# Patient Record
Sex: Female | Born: 1970
Health system: Southern US, Community
[De-identification: ages and names within clinical notes are randomized; demographics above are authoritative.]

## PROBLEM LIST (undated history)

## (undated) DIAGNOSIS — D649 Anemia, unspecified: Secondary | ICD-10-CM

## (undated) DIAGNOSIS — T8172XA Complication of vein following a procedure, not elsewhere classified, initial encounter: Secondary | ICD-10-CM

## (undated) DIAGNOSIS — D259 Leiomyoma of uterus, unspecified: Secondary | ICD-10-CM

## (undated) DIAGNOSIS — I2699 Other pulmonary embolism without acute cor pulmonale: Secondary | ICD-10-CM

## (undated) HISTORY — PX: MOUTH SURGERY: SHX715

## (undated) HISTORY — DX: Complication of vein following a procedure, not elsewhere classified, initial encounter: T81.72XA

## (undated) HISTORY — PX: TUBAL LIGATION: SHX77

## (undated) HISTORY — PX: ABDOMINAL HYSTERECTOMY: SHX81

## (undated) HISTORY — DX: Other pulmonary embolism without acute cor pulmonale: I26.99

## (undated) HISTORY — DX: Anemia, unspecified: D64.9

## (undated) HISTORY — PX: NOVASURE ABLATION: SHX5394

## (undated) HISTORY — PX: LASIK: SHX215

## (undated) HISTORY — DX: Leiomyoma of uterus, unspecified: D25.9

---

## 1998-09-09 HISTORY — PX: MYOMECTOMY: SHX85

## 2001-11-11 ENCOUNTER — Other Ambulatory Visit: Admission: RE | Admit: 2001-11-11 | Discharge: 2001-11-11 | Payer: Self-pay | Admitting: Obstetrics and Gynecology

## 2002-12-31 ENCOUNTER — Other Ambulatory Visit: Admission: RE | Admit: 2002-12-31 | Discharge: 2002-12-31 | Payer: Self-pay | Admitting: Obstetrics and Gynecology

## 2003-07-05 ENCOUNTER — Other Ambulatory Visit: Admission: RE | Admit: 2003-07-05 | Discharge: 2003-07-05 | Payer: Self-pay | Admitting: Obstetrics and Gynecology

## 2003-07-06 ENCOUNTER — Other Ambulatory Visit: Admission: RE | Admit: 2003-07-06 | Discharge: 2003-07-06 | Payer: Self-pay | Admitting: Obstetrics and Gynecology

## 2003-09-10 HISTORY — PX: TUBAL LIGATION: SHX77

## 2003-12-07 ENCOUNTER — Inpatient Hospital Stay (HOSPITAL_COMMUNITY): Admission: AD | Admit: 2003-12-07 | Discharge: 2003-12-07 | Payer: Self-pay | Admitting: Obstetrics and Gynecology

## 2003-12-13 ENCOUNTER — Inpatient Hospital Stay (HOSPITAL_COMMUNITY): Admission: AD | Admit: 2003-12-13 | Discharge: 2003-12-13 | Payer: Self-pay | Admitting: Obstetrics and Gynecology

## 2003-12-22 ENCOUNTER — Inpatient Hospital Stay (HOSPITAL_COMMUNITY): Admission: AD | Admit: 2003-12-22 | Discharge: 2003-12-22 | Payer: Self-pay | Admitting: *Deleted

## 2003-12-26 ENCOUNTER — Encounter: Admission: RE | Admit: 2003-12-26 | Discharge: 2003-12-26 | Payer: Self-pay | Admitting: Obstetrics and Gynecology

## 2003-12-29 ENCOUNTER — Inpatient Hospital Stay (HOSPITAL_COMMUNITY): Admission: AD | Admit: 2003-12-29 | Discharge: 2004-01-02 | Payer: Self-pay | Admitting: Obstetrics and Gynecology

## 2003-12-29 ENCOUNTER — Encounter (INDEPENDENT_AMBULATORY_CARE_PROVIDER_SITE_OTHER): Payer: Self-pay | Admitting: Specialist

## 2007-08-26 ENCOUNTER — Ambulatory Visit (HOSPITAL_COMMUNITY): Admission: RE | Admit: 2007-08-26 | Discharge: 2007-08-26 | Payer: Self-pay | Admitting: Obstetrics and Gynecology

## 2011-01-22 NOTE — Op Note (Signed)
Julie Johns, Julie Johns            ACCOUNT NO.:  [id number]   MEDICAL RECORD NO.:  [id number]          PATIENT TYPE:  AMB   LOCATION:  SDC                           FACILITY:  [facility]   PHYSICIAN:  Maxie Better, M.D.DATE OF BIRTH:  Jun 09, 1971   DATE OF PROCEDURE:  08/26/2007  DATE OF DISCHARGE:                               OPERATIVE REPORT   PREOPERATIVE DIAGNOSIS:  Menorrhagia.   PROCEDURE:  Diagnostic hysteroscopy, Novasure endometrial ablation.   POSTOPERATIVE DIAGNOSIS:  Menorrhagia, endometrial polyp.   ANESTHESIA:  General, paracervical block.   SURGEON:  Maxie Better, M.D.   PROCEDURE:  Under adequate general anesthesia the patient is placed in  the dorsal lithotomy position.  She was sterilely prepped and draped in  usual fashion.  The bladder was catheterized for small amount of urine.  Examination under anesthesia revealed an anteverted uterus about 7-week  size.  No adnexal masses could be appreciated.  A bivalve speculum  placed in vagina.  0.5% Marcaine was injected paracervical at 3 and 9  o'clock.  Prior to transfer to the operating room the patient had  received IV Toradol.  The cervix was grasped with a single-tooth  tenaculum.  The uterus sounded to 8 cm.  The endocervical canal sounded  to 4 cm. Difference in cavity is therefore 4 cm.  The cervix was then  dilated gently, diagnostic hysteroscope was introduced in the cavity and  endometrium was consistent with prior use of birth control pills.  There  was a small polypoid lesion noted on the right as well as a small raised  lesion which was either small fibroid or polyp.  The hysteroscope was  removed, cervix was dilated #8.  The Novasure apparatus was introduced  after setting of four had been placed with a cavity depth and the  instrument was used to explore the cavity in all quadrants.  It was then  ultimately tested and finally deployed and with the lasting for 75  seconds.  The Novasure apparatus  was then removed.  The hysteroscope was  then re-inserted and evidence of complete ablation of the cavity was  accomplished.  Procedure was then terminated by removing all  instruments.  Specimen was none.  Complication was none.  Fluid deficit  was 100 mL.  The patient tolerated the procedure well was transferred to  recovery room in stable condition.      Maxie Better, M.D.  Electronically Signed     [place]/MEDQ  D:  08/26/2007  T:  08/27/2007  Job:  [id number]

## 2011-01-25 NOTE — Op Note (Signed)
NAMETARON, CONREY                        ACCOUNT NO.:  [id number]   MEDICAL RECORD NO.:  [id number]                   PATIENT TYPE:  INP   LOCATION:  9112                                 FACILITY:  [facility]   PHYSICIAN:  Maxie Better, M.D.            DATE OF BIRTH:  November 19, 1970   DATE OF PROCEDURE:  12/29/2003  DATE OF DISCHARGE:                                 OPERATIVE REPORT   PREOPERATIVE DIAGNOSES:  1. Preterm labor.  2. Previous cesarean section.  3. Desires sterilization.  4. Keloid scar.  5. Intrauterine gestation at 35 weeks.   PROCEDURES:  1. Repeat cesarean section.  2. Modified Pomeroy tubal ligation.  3. Revision of keloid scar.  4. Submucosal myomectomy.   POSTOPERATIVE DIAGNOSES:  1. Preterm labor.  2. Intrauterine gestation at 35 weeks.  3. Previous cesarean section.  4. Desires sterilization.  5. Keloid scar.  6. Submucosal fibroid.   ANESTHESIA:  Spinal.   SURGEON:  Maxie Better, M.D.   ASSISTANT:  Charles A. Clearance Coots, M.D.   INDICATIONS:  This is a 40 year old gravida 3, para 0-1-1-1, married black  female at 26 weeks' gestation with a previous cesarean section, who  presented in labor and who desires a repeat cesarean section.  The patient  also wants a permanent sterilization.  She was scheduled for a repeat  cesarean section and tubal ligation on Jan 27, 2004.  Her prenatal care has  been complicated by preterm labor, for which the patient has been on  tocolysis until today.  The patient has also received Delalutin to prevent  preterm labor/preterm delivery.  She received betamethasone at 28 weeks, as  she has been followed with fetal fibronectin every two weeks as well as  cervical length by ultrasound.  Her history is notable for a previous  myomectomy and a previous preterm delivery at 32 weeks.  The patient has  intact membranes.  Her group B strep culture at 28 weeks was negative.  The  patient presented with complaints of very  painful contractions occurring  about every two minutes.  She had good fetal movement.  Her exam, which had  been consistently long and closed in the office, was 1 cm dilated, vertex at  a -2 station.  The baby had been evaluated for a fetal arrhythmia with a  normal echocardiogram and a finding of premature atrial contractions, which  appears to have resolved.  Due to the gestational age and the change in  cervical dilation, the decision was made to proceed with a repeat cesarean  section at this time.  The patient has had a keloid scar noted and removal  was discussed and agreed upon.  Risks and benefits of the planned procedure  were explained to the patient and her husband, consent was signed, and the  patient was transferred to the operating room.   PROCEDURE:  Under adequate spinal anesthesia, the patient was placed in the  supine  position with a left lateral tilt.  She was sterilely prepped and  draped in the usual fashion.  An indwelling Foley catheter was sterilely  placed.  The patient has a known LATEX allergy, and therefore this was  avoided.  A large vertical keloid scar was noted.  Marcaine 0.25% was  injected along the previous scar and to the right of the edge of the keloid.  A vertical incision is then made, carried down to the rectus fascia using  Bovie cautery.  The rectus fascia was opened superiorly and inferiorly.  The  parietal peritoneum was then opened under direct visualization superiorly  and inferiorly.  There was a small amount of adhesion on the right lower  aspect of the uterus.  The lower uterine segment was well-developed.  The  vesicouterine peritoneum was then opened, the bladder bluntly dissected off  the lower uterine segment and displaced inferiorly with a bladder retractor.  A curvilinear low transverse uterine incision was then made and extended  bilaterally with bandage scissors.  Artificial rupture of membranes  occurred.  Clear fluid was noted.   Subsequent delivery of a live female from  the vertex position was accomplished with a cord around the neck easily  reduced.  The cord was clamped, cut.  The baby was bulb-suctioned on the  abdomen, and the baby was transferred to the awaiting pediatricians, who  subsequently assigned Apgars of 8 and 9 at one and five minutes.  Subsequent  weight of the baby was 5 pounds 4 ounces.  The placenta, which was  posterior, was spontaneous in its removal and was intact.  It was sent to  pathology.  The uterus was not exteriorized.  There was a palpable small  subserosal fibroid on the anterior aspect of the uterus.  On its palpation  and cleaning of the intrauterine cavity, it was noted that there was a 3.5  cm submucosal fibroid projecting into the cavity.  Due to its location and  ability for removal, a decision was then made to remove the fibroid.  The  fibroid was grasped with a towel clip and cautery was then used to open the  overlying endometrium.  The fibroid was then enucleated from its base.  A  smaller fibroid was then found, and it was also removed at that time.  The  defect in the uterus was then closed with a 0 Monocryl figure-of-eight  suture and the specimen sent off.  The uterine cavity was then cleaned and  with additional palpation, no further fibroids were noted.  The uterine  incision had no extension.  The uterine incision was closed with 0 Monocryl  running locked stitch in one layer, good hemostasis then noted.  The  vesicouterine peritoneum was not bleeding.  Attention was then turned to the  tubes and ovaries.  Both ovaries were normal.  There was an area adherent to  a portion of the tube in its midsegment bilaterally, and this appeared to be  congenital rather than secondary to adhesion.  The round ligament was  identified bilaterally and the fallopian tube was identified down to its  fimbriated end.  The midportion of the tube away from this partially adherent site was  tented with a Babcock.  The underlying mesosalpinx was  then opened.  The proximal and distal portion of the tube was tied with 0  chromic x2 proximally and distally and the intervening segment of tube was  then removed.  This was performed on the contralateral  tube as well.  The  abdomen was then irrigated and suctioned of debris.  Attention was then  turned back to the uterine incision, which good hemostasis was noted, and a  second layer closure was not placed.  The paracolic gutters were cleaned of  debris.  The rectus fascia was then closed with 0 Vicryl x2.  At that point  the remaining portion of the keloid scar was then sharply dissected and the  underlying scar tissue was removed.  The subcutaneous area was then closed  with interrupted 3-0 plain suture as well as a running stitch of 2-0 plain  suture noted to approximate the skin incision.  The skin was then  approximated with Ethicon staples without tension on the incision site.   SPECIMENS:  Placenta sent to pathology, portion of right and left fallopian  tubes, and keloid scar.   ESTIMATED BLOOD LOSS:  600 mL.   FLUIDS REPLACED:  1700 mL crystalloid.   URINE OUTPUT:  300 mL clear yellow urine.   Sponge and instrument count x2 was correct.   COMPLICATIONS:  None.   The patient was transferred to the recovery room in stable condition.                                               Maxie Better, M.D.    [place]/MEDQ  D:  12/29/2003  T:  12/30/2003  Job:  [id number]

## 2011-01-25 NOTE — Discharge Summary (Signed)
NAMEFARHA, DANO                        ACCOUNT NO.:  [id number]   MEDICAL RECORD NO.:  [id number]                   PATIENT TYPE:  INP   LOCATION:  9112                                 [facility]ACILITY:  [facility]   PHYSICIAN:  Maxie Better, M.D.            DATE OF BIRTH:  Jan 13, 1971   DATE OF ADMISSION:  12/29/2003  DATE OF DISCHARGE:  01/02/2004                                 DISCHARGE SUMMARY   ADMISSION DIAGNOSES:  1. Preterm labor.  2. Intrauterine gestation at 35 weeks.  3. Previous cesarean section.  4. Desires sterilization.   DISCHARGE DIAGNOSES:  1. Intrauterine gestation at 35 weeks, delivered.  2. Desire sterilization.  3. Submucosal fibroid.  4. Maternal bradycardia.  5. Iron deficiency anemia.   PROCEDURE:  Repeat cesarean section, modified Pomeroy tubal ligation,  submucosal myomectomy.   HISTORY OF PRESENT ILLNESS:  This is a 40 year old G3 P0-1-1-1 married black  female at 68 weeks with a previous cesarean section admitted in labor on  December 29, 2003.  Her prenatal course had been notable for preterm labor  treated with tocolytics until 35 weeks.  She had been followed with fetal  fibronectin every 2 weeks and was given alpha-hydroxyprogesterone weekly  until 34 weeks.  Her previous delivery had been at 32 weeks.  The patient  also desires sterilization.   HOSPITAL COURSE:  The patient was admitted to [facility].  She was  confirmed to be contracting every 2 minutes, uncomfortable.  Her exam was 1  cm dilated.  A couple days prior to her admission she had been closed and  long in the office.  With the diagnosis of preterm labor the patient was  taken to the operating room for a repeat cesarean section and permanent  sterilization.  The patient underwent a repeat cesarean section with the  resultant delivery of a live female weighing 5 pounds 4 ounces with Apgars of  8 and 9.  The patient also underwent bilateral tubal ligation.  A submucosal  fibroid  was noted and was removed at the time of her surgery.  The patient's  postoperative course was complicated by postoperative day #4 the patient  complained of difficulty lying flat, feels like she is unable to get any  air.  She had no chest pain.  The patient had felt well until this slight  episode the night previously.  However, her pulse had not been taken at the  time.  Her evaluation by her R.N. revealed her blood pressure was elevated,  she had a pulse of 50.  Her blood pressures ranged 108 to 120 over 59 to 73  during her hospitalization.  During the episode her blood pressure was  136/81, 146/85, 151/85, pulse of 50.  She had on clinical exam lungs that  were clear to auscultation, bradycardia with a grade 1/6 systolic ejection  murmur.  Her extremities had no edema.  She had no calf tenderness.  Chest x-  ray and EKG was ordered.  Sinus bradycardia was noted on EKG with a question  of a lateral infarct.  This EKG was reviewed by our anesthesiologist who did  not feel that there was any infarct noted.  The patient's oxygen saturation  was 98%.  Her chest x-ray showed mild cardiac enlargement, no pulmonary  edema.  Her clinical picture was not consistent with pulmonary embolism or  peripartum cardiomyopathy.  The patient later on that morning was seen.  Her  symptoms were unchanged, her clinical exam was unchanged, and with respect  to her surgery, was doing well.  The cardiac issue was at the forefront.  Therefore, a cardiology consultation was obtained in [facility] which is  where the patient lives and practices and is familiar with that cardiology  practice.  She was scheduled for an appointment with the cardiologist at 2  p.m. that afternoon.  The patient otherwise was afebrile, had bowel  movement, was tolerating a regular diet.  Her incision showed no erythema,  induration, or exudate, was a vertical incision with staples.  PIH labs had  not been performed as the clinical  picture was not consistent with that.  CBC on postoperative day #1 showed a hemoglobin of 9.5; her preoperative  hemoglobin was 10.7; platelet count was 257,000; and white count was 14.9.  The patient was deemed well to be otherwise discharged.  Her blood pressure  had been 151/87, her pulse was 50-60.   DISPOSITION:  Home with appointment at 2 p.m. with the cardiologist in [facility], Dr. Bary Castilla.   CONDITION:  Stable.   FOLLOW-UP APPOINTMENT:  1. Staple removal on Jan 09, 2004 at the office.  2. Dr. Bary Castilla at [facility] group at 2 p.m.  3. Postpartum OB care in 4-6 weeks.   DISCHARGE MEDICATIONS:  1. Iron 325 mg one p.o. b.i.d.  2. Prenatal vitamins one p.o. daily.  3. Darvocet-N 100 #20 one to two tablets q.3-4h. p.r.n. pain.  4. Motrin 600 mg #90 one p.o. q.6h. p.r.n. pain.   DISCHARGE INSTRUCTIONS:  Per postpartum booklet given to the patient.   FINAL PATHOLOGY:  Complete transection of the fallopian tubes.  Placenta  with a partial circummarginate membrane insertion.  Fragment of a  leiomyomata measuring 25 g and 5.2 cm.                                               Maxie Better, M.D.    [place]/MEDQ  D:  01/13/2004  T:  01/13/2004  Job:  [id number]

## 2011-06-14 LAB — CBC
HCT: 37.6
Hemoglobin: 12.9
MCHC: 34.3
MCV: 96.9
Platelets: 237
RBC: 3.88
RDW: 12
WBC: 8.7

## 2012-04-17 ENCOUNTER — Other Ambulatory Visit: Payer: Self-pay | Admitting: Obstetrics and Gynecology

## 2012-04-17 DIAGNOSIS — D219 Benign neoplasm of connective and other soft tissue, unspecified: Secondary | ICD-10-CM

## 2012-04-21 ENCOUNTER — Ambulatory Visit
Admission: RE | Admit: 2012-04-21 | Discharge: 2012-04-21 | Disposition: A | Discharge status: Home / Self Care | Payer: Commercial Managed Care - PPO | Source: Ambulatory Visit | Attending: Obstetrics and Gynecology | Admitting: Obstetrics and Gynecology

## 2012-04-21 DIAGNOSIS — D219 Benign neoplasm of connective and other soft tissue, unspecified: Secondary | ICD-10-CM

## 2012-04-21 MED ORDER — GADOBENATE DIMEGLUMINE 529 MG/ML IV SOLN: 13.0000 mL | Freq: Once | INTRAVENOUS | Status: AC | PRN

## 2012-05-04 ENCOUNTER — Encounter (HOSPITAL_COMMUNITY): Payer: Self-pay | Admitting: *Deleted

## 2012-05-09 ENCOUNTER — Encounter (HOSPITAL_COMMUNITY): Payer: Self-pay | Admitting: Pharmacist

## 2012-05-13 ENCOUNTER — Other Ambulatory Visit: Payer: Self-pay | Admitting: Obstetrics and Gynecology

## 2012-05-20 MED ORDER — MUPIROCIN 2 % EX OINT: TOPICAL_OINTMENT | Freq: Two times a day (BID) | CUTANEOUS | Status: DC

## 2012-05-21 ENCOUNTER — Encounter (HOSPITAL_COMMUNITY): Payer: Self-pay

## 2012-05-21 ENCOUNTER — Ambulatory Visit (HOSPITAL_COMMUNITY): Payer: Commercial Managed Care - PPO | Admitting: Anesthesiology

## 2012-05-21 ENCOUNTER — Encounter (HOSPITAL_COMMUNITY): Payer: Self-pay | Admitting: Anesthesiology

## 2012-05-21 ENCOUNTER — Encounter (HOSPITAL_COMMUNITY)
Admission: RE | Disposition: A | Discharge status: Home / Self Care | Payer: Self-pay | Source: Ambulatory Visit | Attending: Obstetrics and Gynecology

## 2012-05-21 ENCOUNTER — Observation Stay (HOSPITAL_COMMUNITY)
Admission: RE | Admit: 2012-05-21 | Discharge: 2012-05-21 | Disposition: A | Discharge status: Home / Self Care | Payer: Commercial Managed Care - PPO | Source: Ambulatory Visit | Attending: Obstetrics and Gynecology | Admitting: Obstetrics and Gynecology

## 2012-05-21 DIAGNOSIS — D252 Subserosal leiomyoma of uterus: Secondary | ICD-10-CM | POA: Insufficient documentation

## 2012-05-21 DIAGNOSIS — D219 Benign neoplasm of connective and other soft tissue, unspecified: Secondary | ICD-10-CM

## 2012-05-21 DIAGNOSIS — Z90710 Acquired absence of both cervix and uterus: Secondary | ICD-10-CM

## 2012-05-21 DIAGNOSIS — N920 Excessive and frequent menstruation with regular cycle: Secondary | ICD-10-CM | POA: Insufficient documentation

## 2012-05-21 DIAGNOSIS — D251 Intramural leiomyoma of uterus: Principal | ICD-10-CM | POA: Insufficient documentation

## 2012-05-21 LAB — BASIC METABOLIC PANEL WITH GFR
BUN: 10 mg/dL (ref 6–23)
CO2: 26 meq/L (ref 19–32)
Calcium: 9.3 mg/dL (ref 8.4–10.5)
Chloride: 102 meq/L (ref 96–112)
Creatinine, Ser: 0.69 mg/dL (ref 0.50–1.10)
GFR calc Af Amer: >90 mL/min (ref >90)
GFR calc non Af Amer: >90 mL/min (ref >90)
Glucose, Bld: 87 mg/dL (ref 70–99)
Potassium: 3.9 meq/L (ref 3.5–5.1)
Sodium: 137 meq/L (ref 135–145)

## 2012-05-21 LAB — BASIC METABOLIC PANEL
BUN: 11 mg/dL (ref 6–23)
CO2: 24 mEq/L (ref 19–32)
Calcium: 8.9 mg/dL (ref 8.4–10.5)
Chloride: 99 mEq/L (ref 96–112)
Creatinine, Ser: 0.64 mg/dL (ref 0.50–1.10)
GFR calc Af Amer: >90 mL/min (ref >90)
GFR calc non Af Amer: >90 mL/min (ref >90)
Glucose, Bld: 118 mg/dL — ABNORMAL HIGH (ref 70–99)
Potassium: 4.2 mEq/L (ref 3.5–5.1)
Sodium: 134 mEq/L — ABNORMAL LOW (ref 135–145)

## 2012-05-21 LAB — CBC
HCT: 37.7 % (ref 36.0–46.0)
HCT: 39.7 % (ref 36.0–46.0)
Hemoglobin: 12.6 g/dL (ref 12.0–15.0)
Hemoglobin: 13.3 g/dL (ref 12.0–15.0)
MCH: 31.7 pg (ref 26.0–34.0)
MCH: 31.8 pg (ref 26.0–34.0)
MCHC: 33.4 g/dL (ref 30.0–36.0)
MCHC: 33.5 g/dL (ref 30.0–36.0)
MCV: 95 fL (ref 78.0–100.0)
MCV: 95 fL (ref 78.0–100.0)
Platelets: 219 K/uL (ref 150–400)
Platelets: 229 10*3/uL (ref 150–400)
RBC: 3.97 MIL/uL (ref 3.87–5.11)
RBC: 4.18 MIL/uL (ref 3.87–5.11)
RDW: 12 % (ref 11.5–15.5)
RDW: 12 % (ref 11.5–15.5)
WBC: 7 K/uL (ref 4.0–10.5)
WBC: 17 10*3/uL — ABNORMAL HIGH (ref 4.0–10.5)

## 2012-05-21 LAB — SURGICAL PCR SCREEN
MRSA, PCR: NEGATIVE
Staphylococcus aureus: NEGATIVE

## 2012-05-21 SURGERY — ROBOTIC ASSISTED TOTAL HYSTERECTOMY
Anesthesia: General | Site: Abdomen | Wound class: Clean Contaminated

## 2012-05-21 MED ORDER — ONDANSETRON HCL 4 MG/2ML IJ SOLN
INTRAMUSCULAR | Status: AC
  Filled 2012-05-21: qty 2

## 2012-05-21 MED ORDER — MIDAZOLAM HCL 2 MG/2ML IJ SOLN
INTRAMUSCULAR | Status: AC
  Filled 2012-05-21: qty 2

## 2012-05-21 MED ORDER — TRAMADOL HCL 50 MG PO TABS: 50.0000 mg | ORAL_TABLET | Freq: Four times a day (QID) | ORAL | Status: DC | PRN

## 2012-05-21 MED ORDER — ONDANSETRON HCL 4 MG PO TABS: 4.0000 mg | ORAL_TABLET | Freq: Four times a day (QID) | ORAL | Status: DC | PRN

## 2012-05-21 MED ORDER — SCOPOLAMINE 1 MG/3DAYS TD PT72
MEDICATED_PATCH | TRANSDERMAL | Status: AC
  Administered 2012-05-21: 1.5 mg via TRANSDERMAL
  Filled 2012-05-21: qty 1

## 2012-05-21 MED ORDER — FENTANYL CITRATE 0.05 MG/ML IJ SOLN
INTRAMUSCULAR | Status: DC | PRN
  Administered 2012-05-21: 100 ug via INTRAVENOUS
  Administered 2012-05-21: 50 ug via INTRAVENOUS
  Administered 2012-05-21: 150 ug via INTRAVENOUS
  Administered 2012-05-21: 50 ug via INTRAVENOUS

## 2012-05-21 MED ORDER — TRAMADOL HCL 50 MG PO TABS: 50.0000 mg | ORAL_TABLET | Freq: Four times a day (QID) | ORAL | Status: AC | PRN

## 2012-05-21 MED ORDER — IBUPROFEN 800 MG PO TABS
800.0000 mg | ORAL_TABLET | Freq: Three times a day (TID) | ORAL | Status: DC | PRN
  Filled 2012-05-21: qty 1

## 2012-05-21 MED ORDER — GLYCOPYRROLATE 0.2 MG/ML IJ SOLN
INTRAMUSCULAR | Status: AC
  Filled 2012-05-21: qty 1

## 2012-05-21 MED ORDER — ACETAMINOPHEN 10 MG/ML IV SOLN: 1000.0000 mg | Freq: Once | INTRAVENOUS | Status: DC

## 2012-05-21 MED ORDER — ARTIFICIAL TEARS OP OINT
TOPICAL_OINTMENT | OPHTHALMIC | Status: AC
  Filled 2012-05-21: qty 3.5

## 2012-05-21 MED ORDER — SCOPOLAMINE 1 MG/3DAYS TD PT72
1.0000 | MEDICATED_PATCH | TRANSDERMAL | Status: DC
  Administered 2012-05-21: 1.5 mg via TRANSDERMAL

## 2012-05-21 MED ORDER — OXYCODONE-ACETAMINOPHEN 5-325 MG PO TABS
1.0000 | ORAL_TABLET | ORAL | Status: DC | PRN
Start: 2012-05-21 — End: 2012-05-21

## 2012-05-21 MED ORDER — TRIAMCINOLONE ACETONIDE 40 MG/ML IJ SUSP
Freq: Once | INTRAMUSCULAR | Status: AC
  Administered 2012-05-21: 12:00:00 via INTRAMUSCULAR
  Filled 2012-05-21 (×2): qty 1

## 2012-05-21 MED ORDER — KETOROLAC TROMETHAMINE 30 MG/ML IJ SOLN
15.0000 mg | Freq: Once | INTRAMUSCULAR | Status: AC | PRN
  Administered 2012-05-21: 30 mg via INTRAVENOUS

## 2012-05-21 MED ORDER — MUPIROCIN 2 % EX OINT
TOPICAL_OINTMENT | CUTANEOUS | Status: AC
  Administered 2012-05-21: 1 via NASAL
  Filled 2012-05-21: qty 22

## 2012-05-21 MED ORDER — LACTATED RINGERS IV BOLUS (SEPSIS): 1000.0000 mL | Freq: Once | INTRAVENOUS | Status: DC

## 2012-05-21 MED ORDER — ROCURONIUM BROMIDE 100 MG/10ML IV SOLN
INTRAVENOUS | Status: DC | PRN
  Administered 2012-05-21: 10 mg via INTRAVENOUS
  Administered 2012-05-21: 5 mg via INTRAVENOUS
  Administered 2012-05-21 (×3): 10 mg via INTRAVENOUS
  Administered 2012-05-21: 45 mg via INTRAVENOUS
  Administered 2012-05-21: 5 mg via INTRAVENOUS

## 2012-05-21 MED ORDER — KETOROLAC TROMETHAMINE 30 MG/ML IJ SOLN
INTRAMUSCULAR | Status: DC | PRN
  Administered 2012-05-21: 30 mg via INTRAVENOUS

## 2012-05-21 MED ORDER — BUPIVACAINE HCL (PF) 0.25 % IJ SOLN
INTRAMUSCULAR | Status: AC
  Filled 2012-05-21: qty 30

## 2012-05-21 MED ORDER — ONDANSETRON HCL 4 MG/2ML IJ SOLN
INTRAMUSCULAR | Status: DC | PRN
  Administered 2012-05-21: 4 mg via INTRAVENOUS

## 2012-05-21 MED ORDER — NEOSTIGMINE METHYLSULFATE 1 MG/ML IJ SOLN
INTRAMUSCULAR | Status: AC
  Filled 2012-05-21: qty 10

## 2012-05-21 MED ORDER — INFLUENZA VIRUS VACC SPLIT PF IM SUSP
0.5000 mL | INTRAMUSCULAR | Status: AC
  Administered 2012-05-21: 0.5 mL via INTRAMUSCULAR
  Filled 2012-05-21: qty 0.5

## 2012-05-21 MED ORDER — DEXTROSE IN LACTATED RINGERS 5 % IV SOLN: INTRAVENOUS | Status: DC

## 2012-05-21 MED ORDER — MIDAZOLAM HCL 5 MG/5ML IJ SOLN
INTRAMUSCULAR | Status: DC | PRN
  Administered 2012-05-21: 2 mg via INTRAVENOUS

## 2012-05-21 MED ORDER — IBUPROFEN 800 MG PO TABS: 800.0000 mg | ORAL_TABLET | Freq: Four times a day (QID) | ORAL | Status: DC | PRN

## 2012-05-21 MED ORDER — CEFAZOLIN SODIUM-DEXTROSE 2-3 GM-% IV SOLR
2.0000 g | INTRAVENOUS | Status: AC
  Administered 2012-05-21: 2 g via INTRAVENOUS

## 2012-05-21 MED ORDER — MENTHOL 3 MG MT LOZG: 1.0000 | LOZENGE | OROMUCOSAL | Status: DC | PRN

## 2012-05-21 MED ORDER — HYDROMORPHONE HCL 2 MG PO TABS
4.0000 mg | ORAL_TABLET | ORAL | Status: DC | PRN
  Administered 2012-05-21: 2 mg via ORAL
  Filled 2012-05-21: qty 2

## 2012-05-21 MED ORDER — FENTANYL CITRATE 0.05 MG/ML IJ SOLN
INTRAMUSCULAR | Status: AC
  Filled 2012-05-21: qty 2

## 2012-05-21 MED ORDER — PROPOFOL 10 MG/ML IV EMUL
INTRAVENOUS | Status: DC | PRN
  Administered 2012-05-21: 150 mg via INTRAVENOUS

## 2012-05-21 MED ORDER — KETOROLAC TROMETHAMINE 30 MG/ML IJ SOLN: 30.0000 mg | Freq: Four times a day (QID) | INTRAMUSCULAR | Status: DC

## 2012-05-21 MED ORDER — METOCLOPRAMIDE HCL 5 MG/ML IJ SOLN: 10.0000 mg | Freq: Once | INTRAMUSCULAR | Status: DC | PRN

## 2012-05-21 MED ORDER — GLYCOPYRROLATE 0.2 MG/ML IJ SOLN
INTRAMUSCULAR | Status: DC | PRN
  Administered 2012-05-21: .6 mg via INTRAVENOUS

## 2012-05-21 MED ORDER — MUPIROCIN 2 % EX OINT
TOPICAL_OINTMENT | Freq: Two times a day (BID) | CUTANEOUS | Status: DC
  Administered 2012-05-21: 1 via NASAL

## 2012-05-21 MED ORDER — KETOROLAC TROMETHAMINE 30 MG/ML IJ SOLN
INTRAMUSCULAR | Status: AC
  Filled 2012-05-21: qty 1

## 2012-05-21 MED ORDER — ACETAMINOPHEN 10 MG/ML IV SOLN
1000.0000 mg | Freq: Four times a day (QID) | INTRAVENOUS | Status: DC
  Administered 2012-05-21: 1000 mg via INTRAVENOUS

## 2012-05-21 MED ORDER — ONDANSETRON HCL 4 MG/2ML IJ SOLN: 4.0000 mg | Freq: Four times a day (QID) | INTRAMUSCULAR | Status: DC | PRN

## 2012-05-21 MED ORDER — HYDROMORPHONE HCL PF 1 MG/ML IJ SOLN
0.2000 mg | INTRAMUSCULAR | Status: DC | PRN
  Administered 2012-05-21: 0.5 mg via INTRAVENOUS
  Filled 2012-05-21: qty 1

## 2012-05-21 MED ORDER — DEXAMETHASONE SODIUM PHOSPHATE 10 MG/ML IJ SOLN
INTRAMUSCULAR | Status: AC
  Filled 2012-05-21: qty 1

## 2012-05-21 MED ORDER — DEXAMETHASONE SODIUM PHOSPHATE 4 MG/ML IJ SOLN
INTRAMUSCULAR | Status: DC | PRN
  Administered 2012-05-21: 10 mg via INTRAVENOUS

## 2012-05-21 MED ORDER — NEOSTIGMINE METHYLSULFATE 1 MG/ML IJ SOLN
INTRAMUSCULAR | Status: DC | PRN
  Administered 2012-05-21: 3 mg via INTRAVENOUS

## 2012-05-21 MED ORDER — HYDROMORPHONE HCL 2 MG PO TABS: ORAL_TABLET | ORAL | Status: DC

## 2012-05-21 MED ORDER — IBUPROFEN 800 MG PO TABS: 800.0000 mg | ORAL_TABLET | Freq: Three times a day (TID) | ORAL | Status: AC | PRN

## 2012-05-21 MED ORDER — HYDROMORPHONE HCL 2 MG PO TABS
2.0000 mg | ORAL_TABLET | ORAL | Status: DC | PRN
  Administered 2012-05-21: 4 mg via ORAL
  Filled 2012-05-21: qty 2

## 2012-05-21 MED ORDER — ZOLPIDEM TARTRATE 5 MG PO TABS: 5.0000 mg | ORAL_TABLET | Freq: Every evening | ORAL | Status: DC | PRN

## 2012-05-21 MED ORDER — CEFAZOLIN SODIUM-DEXTROSE 2-3 GM-% IV SOLR
INTRAVENOUS | Status: AC
  Filled 2012-05-21: qty 50

## 2012-05-21 MED ORDER — LACTATED RINGERS IR SOLN
Status: DC | PRN
  Administered 2012-05-21: 3000 mL

## 2012-05-21 MED ORDER — FENTANYL CITRATE 0.05 MG/ML IJ SOLN
25.0000 ug | INTRAMUSCULAR | Status: DC | PRN
  Administered 2012-05-21 (×2): 50 ug via INTRAVENOUS

## 2012-05-21 MED ORDER — HYDROMORPHONE HCL 4 MG PO TABS: 4.0000 mg | ORAL_TABLET | ORAL | Status: DC | PRN

## 2012-05-21 MED ORDER — LIDOCAINE HCL (CARDIAC) 20 MG/ML IV SOLN
INTRAVENOUS | Status: AC
  Filled 2012-05-21: qty 5

## 2012-05-21 MED ORDER — BUPIVACAINE HCL (PF) 0.25 % IJ SOLN
INTRAMUSCULAR | Status: DC | PRN
  Administered 2012-05-21: 10 mL

## 2012-05-21 MED ORDER — PANTOPRAZOLE SODIUM 40 MG PO TBEC
40.0000 mg | DELAYED_RELEASE_TABLET | Freq: Every day | ORAL | Status: DC
  Filled 2012-05-21 (×2): qty 1

## 2012-05-21 MED ORDER — KETOROLAC TROMETHAMINE 30 MG/ML IJ SOLN
30.0000 mg | Freq: Four times a day (QID) | INTRAMUSCULAR | Status: DC
  Administered 2012-05-21: 30 mg via INTRAVENOUS
  Filled 2012-05-21: qty 1

## 2012-05-21 MED ORDER — ACETAMINOPHEN 10 MG/ML IV SOLN
INTRAVENOUS | Status: AC
  Filled 2012-05-21: qty 100

## 2012-05-21 MED ORDER — LACTATED RINGERS IV SOLN
INTRAVENOUS | Status: DC
  Administered 2012-05-21: 09:00:00 via INTRAVENOUS
  Administered 2012-05-21: 1000 mL via INTRAVENOUS
  Administered 2012-05-21: 07:00:00 via INTRAVENOUS

## 2012-05-21 MED ORDER — KETOROLAC TROMETHAMINE 30 MG/ML IJ SOLN
INTRAMUSCULAR | Status: AC
  Administered 2012-05-21: 30 mg via INTRAVENOUS
  Filled 2012-05-21: qty 1

## 2012-05-21 MED ORDER — FENTANYL CITRATE 0.05 MG/ML IJ SOLN
INTRAMUSCULAR | Status: AC
  Administered 2012-05-21: 50 ug via INTRAVENOUS
  Filled 2012-05-21: qty 2

## 2012-05-21 MED ORDER — FENTANYL CITRATE 0.05 MG/ML IJ SOLN
INTRAMUSCULAR | Status: AC
  Filled 2012-05-21: qty 5

## 2012-05-21 MED ORDER — PROPOFOL 10 MG/ML IV EMUL
INTRAVENOUS | Status: AC
  Filled 2012-05-21: qty 20

## 2012-05-21 SURGICAL SUPPLY — 62 items
BAG URINE DRAINAGE (UROLOGICAL SUPPLIES) ×3 IMPLANT
BARRIER ADHS 3X4 INTERCEED (GAUZE/BANDAGES/DRESSINGS) ×3 IMPLANT
BLADE MORCELLATOR EXT  12.5X15 (ELECTROSURGICAL) ×1
BLADE MORCELLATOR EXT 12.5X15 (ELECTROSURGICAL) ×2 IMPLANT
CABLE HIGH FREQUENCY MONO STRZ (ELECTRODE) ×3 IMPLANT
CATH FOLEY 3WAY  5CC 16FR (CATHETERS) ×1
CATH FOLEY 3WAY 5CC 16FR (CATHETERS) ×2 IMPLANT
CHLORAPREP W/TINT 26ML (MISCELLANEOUS) ×3 IMPLANT
CLIP SUT LAPRA TY ABSORB (SUTURE) ×3 IMPLANT
CLOTH BEACON ORANGE TIMEOUT ST (SAFETY) ×3 IMPLANT
CONT PATH 16OZ SNAP LID 3702 (MISCELLANEOUS) ×3 IMPLANT
COVER MAYO STAND STRL (DRAPES) ×3 IMPLANT
COVER TABLE BACK 60X90 (DRAPES) ×6 IMPLANT
COVER TIP SHEARS 8 DVNC (MISCELLANEOUS) ×4 IMPLANT
COVER TIP SHEARS 8MM DA VINCI (MISCELLANEOUS) ×2
DECANTER SPIKE VIAL GLASS SM (MISCELLANEOUS) ×3 IMPLANT
DERMABOND ADVANCED (GAUZE/BANDAGES/DRESSINGS) ×1
DERMABOND ADVANCED .7 DNX12 (GAUZE/BANDAGES/DRESSINGS) ×2 IMPLANT
DEVICE TROCAR PUNCTURE CLOSURE (ENDOMECHANICALS) ×3 IMPLANT
DILATOR CANAL MILEX (MISCELLANEOUS) ×3 IMPLANT
DRAPE HUG U DISPOSABLE (DRAPE) ×3 IMPLANT
DRAPE LG THREE QUARTER DISP (DRAPES) ×6 IMPLANT
DRAPE WARM FLUID 44X44 (DRAPE) ×3 IMPLANT
ELECT REM PT RETURN 9FT ADLT (ELECTROSURGICAL) ×3
ELECTRODE REM PT RTRN 9FT ADLT (ELECTROSURGICAL) ×2 IMPLANT
EVACUATOR SMOKE 8.L (FILTER) ×3 IMPLANT
GAUZE VASELINE 3X9 (GAUZE/BANDAGES/DRESSINGS) IMPLANT
GLOVE BIOGEL PI IND STRL 7.0 (GLOVE) ×6 IMPLANT
GLOVE BIOGEL PI INDICATOR 7.0 (GLOVE) ×3
GLOVE SURG SS PI 6.5 STRL IVOR (GLOVE) ×12 IMPLANT
GOWN STRL REIN XL XLG (GOWN DISPOSABLE) ×18 IMPLANT
KIT ACCESSORY DA VINCI DISP (KITS) ×1
KIT ACCESSORY DVNC DISP (KITS) ×2 IMPLANT
NEEDLE INSUFFLATION 14GA 120MM (NEEDLE) ×3 IMPLANT
OCCLUDER COLPOPNEUMO (BALLOONS) ×3 IMPLANT
PACK LAVH (CUSTOM PROCEDURE TRAY) ×3 IMPLANT
PAD PREP 24X48 CUFFED NSTRL (MISCELLANEOUS) ×6 IMPLANT
PLUG CATH AND CAP STER (CATHETERS) ×3 IMPLANT
PROTECTOR NERVE ULNAR (MISCELLANEOUS) ×6 IMPLANT
SCISSORS LAP 5X35 DISP (ENDOMECHANICALS) IMPLANT
SET CYSTO W/LG BORE CLAMP LF (SET/KITS/TRAYS/PACK) IMPLANT
SET IRRIG TUBING LAPAROSCOPIC (IRRIGATION / IRRIGATOR) ×9 IMPLANT
SOLUTION ELECTROLUBE (MISCELLANEOUS) ×3 IMPLANT
SUT PDS AB 0 CT1 36 (SUTURE) ×6 IMPLANT
SUT VIC AB 0 CT1 27 (SUTURE) ×10
SUT VIC AB 0 CT1 27XBRD ANBCTR (SUTURE) ×10 IMPLANT
SUT VIC AB 0 CT1 27XBRD ANTBC (SUTURE) ×10 IMPLANT
SUT VICRYL 0 UR6 27IN ABS (SUTURE) ×6 IMPLANT
SUT VICRYL 4-0 PS2 18IN ABS (SUTURE) ×9 IMPLANT
SYR 50ML LL SCALE MARK (SYRINGE) ×3 IMPLANT
SYRINGE 10CC LL (SYRINGE) ×3 IMPLANT
SYSTEM CONVERTIBLE TROCAR (TROCAR) IMPLANT
TIP UTERINE 6.7X10CM GRN DISP (MISCELLANEOUS) ×3 IMPLANT
TOWEL OR 17X24 6PK STRL BLUE (TOWEL DISPOSABLE) ×9 IMPLANT
TROCAR 12M 150ML BLUNT (TROCAR) ×3 IMPLANT
TROCAR DISP BLADELESS 8 DVNC (TROCAR) ×2 IMPLANT
TROCAR DISP BLADELESS 8MM (TROCAR) ×1
TROCAR XCEL 12X100 BLDLESS (ENDOMECHANICALS) ×3 IMPLANT
TROCAR Z-THREAD 12X150 (TROCAR) ×3 IMPLANT
TUBING FILTER THERMOFLATOR (ELECTROSURGICAL) ×3 IMPLANT
WARMER LAPAROSCOPE (MISCELLANEOUS) ×3 IMPLANT
WATER STERILE IRR 1000ML POUR (IV SOLUTION) ×9 IMPLANT

## 2012-05-21 NOTE — Anesthesia Postprocedure Evaluation (Signed)
Anesthesia Post Note  Patient: Julie Johns  Procedure(s) Performed: Procedure(s) (LRB): ROBOTIC ASSISTED TOTAL HYSTERECTOMY (N/A) BILATERAL SALPINGECTOMY (Bilateral)  Anesthesia type: General  Patient location: PACU  Post pain: Pain level controlled  Post assessment: Post-op Vital signs reviewed  Last Vitals:  Filed Vitals:   05/21/12 1330  BP: 117/62  Pulse: 85  Temp: 37.1 C  Resp: 20    Post vital signs: Reviewed  Level of consciousness: sedated  Complications: No apparent anesthesia complicationsfj

## 2012-05-21 NOTE — Progress Notes (Signed)
Pt discharged to home.  Pt left in wheelchair. Teaching complete.

## 2012-05-21 NOTE — Brief Op Note (Signed)
05/21/2012  12:29 PM  PATIENT:  Julie Johns  41 y.o. female  PRE-OPERATIVE DIAGNOSIS:  Enlarging uterine fibroid  POST-OPERATIVE DIAGNOSIS:  Enlarging uterine fibroid  PROCEDURE:  Da Vinci robotic total hysterectomy, bilateral salpingectomy  SURGEON:  Surgeon(s) and Role:    * Daney Moor Cathie Beams, MD - Primary    * Genia Del, MD - Assisting  PHYSICIAN ASSISTANT:   ASSISTANT: Alphonzo Severance, MD  ANESTHESIA:   general FINDINGS: Right ovarian superior and post fundal to large  Right subserosal/lateral and posterior fibroid. Appendix and terminal ileum with adhesions to side wall. Left ov normal, left tube surgical separation adherent to ovary, some bladder adhesion,  Uterus 2-3 FB below umbilicus. Ureters seen peristalsing . Right ureter noted post procedure. Nl liver edge  EBL:  Total I/O In: 1800 [I.V.:1800] Out: 175 [Urine:125; Blood:50]  BLOOD ADMINISTERED:none  DRAINS: none   LOCAL MEDICATIONS USED:  MARCAINE     SPECIMEN:  Source of Specimen:  morcellated uterine fibroid  DISPOSITION OF SPECIMEN:  PATHOLOGY  COUNTS:  YES  TOURNIQUET:  * No tourniquets in log *  DICTATION: .Other Dictation: Dictation Number   PLAN OF CARE: place in observation  PATIENT DISPOSITION:  PACU - hemodynamically stable.   Delay start of Pharmacological VTE agent (>24hrs) due to surgical blood loss or risk of bleeding: no

## 2012-05-21 NOTE — Anesthesia Procedure Notes (Signed)
Procedure Name: Intubation Date/Time: 05/21/2012 7:30 AM Performed by: Isabella Bowens R Pre-anesthesia Checklist: Patient identified, Emergency Drugs available, Suction available, Timeout performed and Patient being monitored Patient Re-evaluated:Patient Re-evaluated prior to inductionOxygen Delivery Method: Circle system utilized Preoxygenation: Pre-oxygenation with 100% oxygen Intubation Type: IV induction Ventilation: Mask ventilation without difficulty Laryngoscope Size: Mac and 3 Grade View: Grade II Tube type: Oral Tube size: 7.0 mm Number of attempts: 1 Airway Equipment and Method: Stylet Placement Confirmation: ETT inserted through vocal cords under direct vision,  positive ETCO2 and breath sounds checked- equal and bilateral Secured at: 22 cm Tube secured with: Tape Dental Injury: Teeth and Oropharynx as per pre-operative assessment  Difficulty Due To: Difficulty was unanticipated

## 2012-05-21 NOTE — Anesthesia Preprocedure Evaluation (Signed)
Anesthesia Evaluation  Patient identified by MRN, date of birth, ID band Patient awake    Reviewed: Allergy & Precautions, H&P , NPO status , Patient's Chart, lab work & pertinent test results, reviewed documented beta blocker date and time   History of Anesthesia Complications (+) PONV  Airway Mallampati: II TM Distance: >3 FB Neck ROM: full    Dental  (+) Teeth Intact   Pulmonary neg pulmonary ROS,  breath sounds clear to auscultation        Cardiovascular Exercise Tolerance: Good negative cardio ROS  Rhythm:regular Rate:Normal     Neuro/Psych negative neurological ROS  negative psych ROS   GI/Hepatic negative GI ROS, Neg liver ROS,   Endo/Other  negative endocrine ROS  Renal/GU negative Renal ROS  Female GU complaint     Musculoskeletal   Abdominal   Peds  Hematology negative hematology ROS (+)   Anesthesia Other Findings   Reproductive/Obstetrics negative OB ROS                           Anesthesia Physical Anesthesia Plan  ASA: I  Anesthesia Plan: General ETT   Post-op Pain Management:    Induction:   Airway Management Planned:   Additional Equipment:   Intra-op Plan:   Post-operative Plan:   Informed Consent: I have reviewed the patients History and Physical, chart, labs and discussed the procedure including the risks, benefits and alternatives for the proposed anesthesia with the patient or authorized representative who has indicated his/her understanding and acceptance.   Dental Advisory Given  Plan Discussed with: Surgeon and CRNA  Anesthesia Plan Comments:         Anesthesia Quick Evaluation

## 2012-05-21 NOTE — Transfer of Care (Signed)
Immediate Anesthesia Transfer of Care Note  Patient: Julie Johns  Procedure(s) Performed: Procedure(s) (LRB) with comments: ROBOTIC ASSISTED TOTAL HYSTERECTOMY (N/A) - 3 hrs. BILATERAL SALPINGECTOMY (Bilateral)  Patient Location: PACU  Anesthesia Type: General  Level of Consciousness: oriented and sedated  Airway & Oxygen Therapy: Patient Spontanous Breathing and Patient connected to nasal cannula oxygen  Post-op Assessment: Report given to PACU RN and Post -op Vital signs reviewed and stable  Post vital signs: Reviewed and stable  Complications: No apparent anesthesia complications

## 2012-05-22 NOTE — Op Note (Addendum)
Julie Johns, Julie Johns            ACCOUNT NO.:  [id number]  MEDICAL RECORD NO.:  [id number]  LOCATION:  9302                          FACILITY:  [facility]  PHYSICIAN:  Maxie Better, M.D.DATE OF BIRTH:  Dec 08, 1970  DATE OF PROCEDURE: DATE OF DISCHARGE:  05/21/2012                              OPERATIVE REPORT   PREOPERATIVE DIAGNOSIS:  Enlarging uterine fibroids.  POSTOPERATIVE DIAGNOSIS: Enlarging  Fibroid uterus.  PROCEDURES:  Da Vinci robotic total hysterectomy and bilateral salpingectomy.  ANESTHESIA:  General.  SURGEON:  Maxie Better, M.D.  ASSISTANT: Marlinda Mike CNM            Kelly Fogleman,M.D  PROCEDURE IN DETAIL:  Under adequate general anesthesia, the patient was positioned for robotic surgery.  She was ChloraPrep and Betadine prepped in usual fashion.  A weighted speculum was placed in the vagina.  Sims retractors was used anteriorly.  The cervix was grasped with a single- tooth tenaculum.  The 0 Vicryl figure-of-eight sutures were placed on the anterior and posterior lip of the cervix.  The cervix was then attempted to be dilated.  Ultimately os finders was utilized and thereafter the cervix was able to be dilated and sounded to 10 cm. Using a medium-sized RUMI KOH ring.  The RUMI apparatus was secured around the cervical vaginal junction, and the balloon was insufflated in the uterine cavity without incident.  An three-way indwelling Foley catheter was then sterilely placed.  Attention was then turned to the abdomen.  The patient has a known previous vertical skin incision.  The uterus on examination can be extended up to the umbilicus though wide with mobile, and therefore, about 3 fingerbreadths above the upper portion of the umbilicus.  A small incision was then made.  Veress needle was introduced and tested with normal saline, 3 L of CO2 was subsequently insufflated.  The Veress needle was removed.  The incision extended and a 12-mm disposable  trocar with sleeve was introduced into the abdomen without incident.  The robotic camera port was then placed through that port site.  Panoramic inspection was done.  There was a large fibroid uterus noted with the right tube and ovary sitting posterior and at the top of the fibroid.  There was evidence of surgical interruptions of the fallopian tubes.  Some adhesions were around the left tube and on the left ovary. Both ovaries were normal.  The appendix which was normal had periappendiceal adhesions  as well as the distal ileum attaching it to that sidewall and normal liver edge was noted.  At that point, the marking on the abdomen was outlined and 8 mm ports were placed, 2 on the left and 1 on the right and a right lower quadrant assistant port was then placed. Once this was done without incident, the robot was then docked to the patient's left side.  Arm #1 had monopolar scissors, arm #2 had the PK dissector, and arm #3 had a ProGrasp instrument.  At that point, I then went to the patient console.  At the patient console, the pelvis was further inspected.  It was evident there was a large posterior lateral fibroid with some extension almost intraligamentous, but the right ureter was seen  peristalsing deeper in the pelvis.  The procedure was started on the left side where the left retroperitoneal space was opened and dissection carried down deeply.  The ureter was noted to be peristalsing deep in the pelvis. A window in the peritoneum was made below the utero-ovarian ligament.  It was clamped, cauterized, and  cut.  The round ligament was on the left was subsequently cauterized and cut.  The bladder had some adhesions from the patient's prior surgery.  The lower uterine segment was inspected.  The anterior leaf of the broad ligament was continued to be opened.  The vesicouterine peritoneum was then opened and dissected off at the cervical portion.  The left uterine vessels was subsequently  seen was skeletonization and the left uterine vessels were serially clamped, cauterized, and then cut.  Attention was turned to the opposite side. The ureter was once again identified anterior to pelvic brim.  The remaining portion of the adhesions on the bladder area was lysed and carefully pushed down inferiorly.  The fibroid was pulled laterally in order to facilitate the surgery.  The retroperitoneal space was opened.  The appendix where the adhesions on the appendix itself was lysed and pushed superiorly.  The retroperitoneal space was opened.  The ureters were noted to the medial on the right side of the patient.  During the retroperitoneal space was opened and the utero-ovarian ligament on the right was high above the fibroid.  The serial clamping of the utero-ovarian ligament on the right was accomplished ultimately with that.  The posterior leaf of the broad ligament was then opened as well.  Careful dissection was continued keeping an eye on where the ureter was traversing towards the bladder. Finally after getting areola tissue from around the fibroid the uterine vessels were subsequently identified, serially clamped, cauterized, and partially cut.  The bladder had been further sharply dissected throughout and slid down and displaced inferiorly.  With the right side the fallopian tube and distal end of mesosalpinx was sequentially clamped, cauterized, and cut and that right tube was able to be brought out to the right lower quadrant assistant port and it was.  Once that was done, the cervicovaginal junction was then opened and extended transversely as the angle to the right was reached.  Additional cauterization was then performed and circumferentially the fibroid uterus was removed above the uterosacral ligament.  The distortion and uterine size was not making it amenable to deliver this specimen through the vagina, so the Rumi cup apparatus was detached and specimen displaced  cephalic.  The vaginal cuff was had good hemostasis, and small bleeders cauterized.  These robotic instruments were then removed and were replaced by long tip forceps and  Large needle driver.  The 0 PDS was brought down through the 12 mm port.  0 PDS times two was used to close the vaginal cuff.This was performed on the opposite side with 0 PDS being tied in midline.  The pelvis was inspected digitally by the assistant between the patient's legs and confirmed good approximation of the vaginal cuff.  Once this was done, I left the console and returned sterilely. Robot was undocked. The morcellator was then used to place the right lower quadrant port site.  Once this was done, the fibroid was carefully morcellated and all pieces in the pelvis was noted and removed.  Abdomen was irrigated with good hemostasis noted.  Once this was achieved, it was noted that the left fallopian tube distal was not going to  be easily removed using the gyrus.  So, the robot was re-docked sterilely and using my PK dissector and monopolar scissors the fallopian tube portion that remained was removed.  Once again, irrigation and suctioned in the abdomen again was done.  Good hemostasis noted.  The port sites were all removed carefully.  The fascial layer was identified and closed with 0- Vicryl in the supraumbilical site and in the right lower quadrant port site.  The skin incisions were then closed with 4-0 Vicryl subcuticular stitches, and all incisions were then injected with Kenalog with 0.25% Marcaine.  The patient tolerated the procedure well, was transferred to recovery in stable condition.  Weight of the specimen 624grams.  SPECIMEN:  Morcellated uterus with cervix and fallopian tubes sent to pathology.  ESTIMATED BLOOD LOSS:  50 mL.  INTRAOPERATIVE FLUID:  1 L.  URINE OUTPUT:  150 mL concentrated urine.  COUNTS:  Sponge and instrument counts x2 was correct.  COMPLICATION:  None.  Patient tolerated  procedure well and was transferred to Recovery room in stable condition.   Maxie Better, M.D.     Gratz/MEDQ  D:  05/21/2012  T:  05/22/2012  Job:  [id number]

## 2012-05-26 ENCOUNTER — Encounter (HOSPITAL_COMMUNITY): Payer: Self-pay | Admitting: *Deleted

## 2012-05-26 ENCOUNTER — Inpatient Hospital Stay (HOSPITAL_COMMUNITY)
Admission: AD | Admit: 2012-05-26 | Discharge: 2012-05-26 | Disposition: A | Discharge status: Home / Self Care | Payer: Commercial Managed Care - PPO | Source: Ambulatory Visit | Attending: Obstetrics and Gynecology | Admitting: Obstetrics and Gynecology

## 2012-05-26 ENCOUNTER — Inpatient Hospital Stay (HOSPITAL_COMMUNITY): Payer: Commercial Managed Care - PPO

## 2012-05-26 DIAGNOSIS — R142 Eructation: Secondary | ICD-10-CM | POA: Insufficient documentation

## 2012-05-26 DIAGNOSIS — K5900 Constipation, unspecified: Secondary | ICD-10-CM | POA: Insufficient documentation

## 2012-05-26 DIAGNOSIS — R109 Unspecified abdominal pain: Secondary | ICD-10-CM | POA: Insufficient documentation

## 2012-05-26 DIAGNOSIS — R141 Gas pain: Secondary | ICD-10-CM | POA: Insufficient documentation

## 2012-05-26 LAB — URINALYSIS, ROUTINE W REFLEX MICROSCOPIC
Bilirubin Urine: NEGATIVE
Glucose, UA: NEGATIVE mg/dL
Hgb urine dipstick: NEGATIVE
Ketones, ur: NEGATIVE mg/dL
Leukocytes, UA: NEGATIVE
Nitrite: NEGATIVE
Protein, ur: NEGATIVE mg/dL
Specific Gravity, Urine: 1.015 (ref 1.005–1.030)
Urobilinogen, UA: 0.2 mg/dL (ref 0.0–1.0)
pH: 6 (ref 5.0–8.0)

## 2012-05-26 LAB — CBC WITH DIFFERENTIAL/PLATELET
Basophils Absolute: 0 10*3/uL (ref 0.0–0.1)
Basophils Relative: 0 % (ref 0–1)
Eosinophils Absolute: 0.1 10*3/uL (ref 0.0–0.7)
Eosinophils Relative: 1 % (ref 0–5)
HCT: 35.6 % — ABNORMAL LOW (ref 36.0–46.0)
Hemoglobin: 11.9 g/dL — ABNORMAL LOW (ref 12.0–15.0)
Lymphocytes Relative: 12 % (ref 12–46)
Lymphs Abs: 1.7 10*3/uL (ref 0.7–4.0)
MCH: 31.6 pg (ref 26.0–34.0)
MCHC: 33.4 g/dL (ref 30.0–36.0)
MCV: 94.4 fL (ref 78.0–100.0)
Monocytes Absolute: 1.5 10*3/uL — ABNORMAL HIGH (ref 0.1–1.0)
Monocytes Relative: 11 % (ref 3–12)
Neutro Abs: 10.4 10*3/uL — ABNORMAL HIGH (ref 1.7–7.7)
Neutrophils Relative %: 76 % (ref 43–77)
Platelets: 254 10*3/uL (ref 150–400)
RBC: 3.77 MIL/uL — ABNORMAL LOW (ref 3.87–5.11)
RDW: 12 % (ref 11.5–15.5)
WBC: 13.7 10*3/uL — ABNORMAL HIGH (ref 4.0–10.5)

## 2012-05-26 LAB — BASIC METABOLIC PANEL
BUN: 11 mg/dL (ref 6–23)
CO2: 30 mEq/L (ref 19–32)
Calcium: 9.2 mg/dL (ref 8.4–10.5)
Chloride: 99 mEq/L (ref 96–112)
Creatinine, Ser: 0.67 mg/dL (ref 0.50–1.10)
GFR calc Af Amer: >90 mL/min (ref >90)
GFR calc non Af Amer: >90 mL/min (ref >90)
Glucose, Bld: 104 mg/dL — ABNORMAL HIGH (ref 70–99)
Potassium: 3.9 mEq/L (ref 3.5–5.1)
Sodium: 137 mEq/L (ref 135–145)

## 2012-05-26 MED ORDER — AMOXICILLIN-POT CLAVULANATE 875-125 MG PO TABS
1.0000 | ORAL_TABLET | Freq: Two times a day (BID) | ORAL | Status: DC
  Administered 2012-05-26: 1 via ORAL
  Filled 2012-05-26 (×2): qty 1

## 2012-05-26 MED ORDER — SODIUM CHLORIDE 0.9 % IJ SOLN
INTRAMUSCULAR | Status: AC
  Filled 2012-05-26: qty 6

## 2012-05-26 MED ORDER — AMOXICILLIN-POT CLAVULANATE 875-125 MG PO TABS: 1.0000 | ORAL_TABLET | Freq: Two times a day (BID) | ORAL | Status: DC

## 2012-05-26 MED ORDER — IOHEXOL 300 MG/ML  SOLN
100.0000 mL | Freq: Once | INTRAMUSCULAR | Status: AC | PRN
  Administered 2012-05-26: 100 mL via INTRAVENOUS

## 2012-05-26 NOTE — MAU Provider Note (Signed)
History    41 yoG2P2 MBF s/p daVinci robotic TLH bilateral salpingectomy 9/12 presents for evaluation of abdominal pain not responsive to pain med as well as temp 99.4. Pt took magnesium citrate and had explosive BM yesterday.  No assoc vomiting or urinary sx  Chief Complaint  Patient presents with  . Abdominal Pain     OB History    Grav Para Term Preterm Abortions TAB SAB Ect Mult Living                  Past Medical History  Diagnosis Date  . PONV (postoperative nausea and vomiting)   . No pertinent past medical history     Past Surgical History  Procedure Date  . Lasik   . Myomectomy 2000  . Tubal ligation 2005  . Mouth surgery   . Cesarean section 2001, 2005  . Tubal ligation   . Novasure ablation     Family History  Problem Relation Age of Onset  . Other Neg Hx     History  Substance Use Topics  . Smoking status: Never Smoker   . Smokeless tobacco: Never Used  . Alcohol Use: Yes     occasional    Allergies:  Allergies  Allergen Reactions  . Morphine And Related Itching    Severe itching  . Percocet (Oxycodone-Acetaminophen) Nausea And Vomiting    Severe n/v  . Stadol (Butorphanol) Other (See Comments)    hallucinations  . Latex Rash  . Sulfa Antibiotics Rash    Prescriptions prior to admission  Medication Sig Dispense Refill  . bisacodyl (DULCOLAX) 10 MG suppository Place 10 mg rectally as needed.      Marland Kitchen HYDROmorphone (DILAUDID) 2 MG tablet One to two tablet every 4-6 hours prn pain  30 tablet  0  . ibuprofen (ADVIL,MOTRIN) 800 MG tablet Take 1 tablet (800 mg total) by mouth every 8 (eight) hours as needed (mild pain).  30 tablet  0  . magnesium citrate SOLN Take 1 Bottle by mouth once.      . traMADol (ULTRAM) 50 MG tablet Take 1 tablet (50 mg total) by mouth every 6 (six) hours as needed.  30 tablet  0  . DISCONTD: HYDROmorphone (DILAUDID) 4 MG tablet Take 1 tablet (4 mg total) by mouth every 4 (four) hours as needed for pain.  30 tablet  0      Physical Exam   Blood pressure 137/79, pulse 77, temperature 98.9 F (37.2 C), temperature source Oral, resp. rate 16, height 5' 4.5" (1.638 m), weight 64.229 kg (141 lb 9.6 oz), SpO2 100.00%.  general: WDWN female uncomfortable Lungs clear to A Cor RRR Abdomen: soft active BS throughout sl distended incision well approximated RLQ site sl bruised right mons w/ some swelling Pelvic: cuff well approximated no discharge.  Extremity: no edema   Labs: CT scan done and reviewed w/ pt: some stranding. Nl postop changes no collection, stool on study.  CBC: wbc 13.7 ( down from 17K same day postop), hct 35.6 hgb 11.9 plt 254K. Nl BMP. Creatinine 0.67 U/a neg ED Course  Postop abdominal pain due to need to pass stool Postop low grade temp w/ elev wbc w/o defined site for infection P) disc readmit for bowel mgmt. Pt feels an manage outpt w/ magnesium citrate, prune juice combo etc. Will empiric start augmentin to cover for poss early cuff infection. F/u as needed prior to sched postop appt next week MDM   Claira Jeter A, MD 8:53 PM 05/26/2012

## 2012-05-26 NOTE — MAU Note (Signed)
Patient states she had a hysterectomy with Davinci on 9-12. Now having abdominal pain and bloating with chills.

## 2012-06-02 ENCOUNTER — Encounter: Payer: Self-pay | Admitting: Cardiology

## 2012-06-02 ENCOUNTER — Other Ambulatory Visit: Payer: Self-pay | Admitting: Obstetrics and Gynecology

## 2012-06-02 DIAGNOSIS — R52 Pain, unspecified: Secondary | ICD-10-CM

## 2012-06-02 DIAGNOSIS — R509 Fever, unspecified: Secondary | ICD-10-CM

## 2012-06-03 ENCOUNTER — Ambulatory Visit
Admission: RE | Admit: 2012-06-03 | Discharge: 2012-06-03 | Disposition: A | Discharge status: Home / Self Care | Payer: Commercial Managed Care - PPO | Source: Ambulatory Visit | Attending: Obstetrics and Gynecology | Admitting: Obstetrics and Gynecology

## 2012-06-03 ENCOUNTER — Other Ambulatory Visit: Payer: Self-pay | Admitting: Obstetrics and Gynecology

## 2012-06-03 ENCOUNTER — Inpatient Hospital Stay (HOSPITAL_COMMUNITY)
Admission: AD | Admit: 2012-06-03 | Discharge: 2012-06-08 | DRG: 299 | Disposition: A | Discharge status: Home / Self Care | Payer: Commercial Managed Care - PPO | Source: Ambulatory Visit | Attending: Obstetrics and Gynecology | Admitting: Obstetrics and Gynecology

## 2012-06-03 ENCOUNTER — Encounter (HOSPITAL_COMMUNITY): Payer: Self-pay | Admitting: *Deleted

## 2012-06-03 ENCOUNTER — Other Ambulatory Visit (HOSPITAL_COMMUNITY): Payer: Self-pay | Admitting: Obstetrics and Gynecology

## 2012-06-03 ENCOUNTER — Ambulatory Visit (HOSPITAL_COMMUNITY)
Admission: RE | Admit: 2012-06-03 | Discharge: 2012-06-03 | Disposition: A | Discharge status: Home / Self Care | Payer: Commercial Managed Care - PPO | Source: Ambulatory Visit | Attending: Obstetrics and Gynecology | Admitting: Obstetrics and Gynecology

## 2012-06-03 DIAGNOSIS — I808 Phlebitis and thrombophlebitis of other sites: Principal | ICD-10-CM | POA: Diagnosis present

## 2012-06-03 DIAGNOSIS — R509 Fever, unspecified: Secondary | ICD-10-CM | POA: Insufficient documentation

## 2012-06-03 DIAGNOSIS — R109 Unspecified abdominal pain: Secondary | ICD-10-CM | POA: Insufficient documentation

## 2012-06-03 DIAGNOSIS — B999 Unspecified infectious disease: Secondary | ICD-10-CM

## 2012-06-03 DIAGNOSIS — R079 Chest pain, unspecified: Secondary | ICD-10-CM

## 2012-06-03 DIAGNOSIS — I2699 Other pulmonary embolism without acute cor pulmonale: Secondary | ICD-10-CM

## 2012-06-03 DIAGNOSIS — D259 Leiomyoma of uterus, unspecified: Secondary | ICD-10-CM | POA: Diagnosis not present

## 2012-06-03 DIAGNOSIS — N898 Other specified noninflammatory disorders of vagina: Secondary | ICD-10-CM | POA: Insufficient documentation

## 2012-06-03 DIAGNOSIS — D649 Anemia, unspecified: Secondary | ICD-10-CM | POA: Diagnosis present

## 2012-06-03 DIAGNOSIS — I2690 Septic pulmonary embolism without acute cor pulmonale: Secondary | ICD-10-CM | POA: Diagnosis present

## 2012-06-03 DIAGNOSIS — R52 Pain, unspecified: Secondary | ICD-10-CM

## 2012-06-03 DIAGNOSIS — T8172XA Complication of vein following a procedure, not elsewhere classified, initial encounter: Secondary | ICD-10-CM

## 2012-06-03 LAB — CBC WITH DIFFERENTIAL/PLATELET
Basophils Absolute: 0.1 10*3/uL (ref 0.0–0.1)
Basophils Relative: 0 % (ref 0–1)
Eosinophils Absolute: 0.1 10*3/uL (ref 0.0–0.7)
Eosinophils Relative: 0 % (ref 0–5)
HCT: 33.6 % — ABNORMAL LOW (ref 36.0–46.0)
Hemoglobin: 11.3 g/dL — ABNORMAL LOW (ref 12.0–15.0)
Lymphocytes Relative: 11 % — ABNORMAL LOW (ref 12–46)
Lymphs Abs: 1.7 10*3/uL (ref 0.7–4.0)
MCH: 31.7 pg (ref 26.0–34.0)
MCHC: 33.6 g/dL (ref 30.0–36.0)
MCV: 94.1 fL (ref 78.0–100.0)
Monocytes Absolute: 1.3 10*3/uL — ABNORMAL HIGH (ref 0.1–1.0)
Monocytes Relative: 8 % (ref 3–12)
Neutro Abs: 12.4 10*3/uL — ABNORMAL HIGH (ref 1.7–7.7)
Neutrophils Relative %: 80 % — ABNORMAL HIGH (ref 43–77)
Platelets: 388 10*3/uL (ref 150–400)
RBC: 3.57 MIL/uL — ABNORMAL LOW (ref 3.87–5.11)
RDW: 12 % (ref 11.5–15.5)
WBC: 15.4 10*3/uL — ABNORMAL HIGH (ref 4.0–10.5)

## 2012-06-03 LAB — PROTIME-INR
INR: 1.1 (ref 0.00–1.49)
Prothrombin Time: 14.1 seconds (ref 11.6–15.2)

## 2012-06-03 LAB — ANTITHROMBIN III: AntiThromb III Func: 90 % (ref 75–120)

## 2012-06-03 LAB — APTT: aPTT: 33 seconds (ref 24–37)

## 2012-06-03 MED ORDER — ACETAMINOPHEN 500 MG PO TABS
1000.0000 mg | ORAL_TABLET | Freq: Four times a day (QID) | ORAL | Status: DC | PRN
  Administered 2012-06-03 – 2012-06-08 (×7): 1000 mg via ORAL
  Filled 2012-06-03 (×8): qty 2

## 2012-06-03 MED ORDER — LACTATED RINGERS IV SOLN
INTRAVENOUS | Status: DC
  Administered 2012-06-03 (×2): via INTRAVENOUS

## 2012-06-03 MED ORDER — IOHEXOL 350 MG/ML SOLN
125.0000 mL | Freq: Once | INTRAVENOUS | Status: AC | PRN
  Administered 2012-06-03: 125 mL via INTRAVENOUS

## 2012-06-03 MED ORDER — WARFARIN - PHARMACIST DOSING INPATIENT
Freq: Every day | Status: DC
  Administered 2012-06-03: 20:00:00

## 2012-06-03 MED ORDER — HEPARIN BOLUS VIA INFUSION
4000.0000 [IU] | Freq: Once | INTRAVENOUS | Status: AC
  Administered 2012-06-03: 4000 [IU] via INTRAVENOUS
  Filled 2012-06-03: qty 4000

## 2012-06-03 MED ORDER — TRAMADOL HCL 50 MG PO TABS
50.0000 mg | ORAL_TABLET | Freq: Four times a day (QID) | ORAL | Status: DC | PRN
  Administered 2012-06-05 – 2012-06-08 (×5): 50 mg via ORAL
  Filled 2012-06-03 (×5): qty 1

## 2012-06-03 MED ORDER — WARFARIN SODIUM 7.5 MG PO TABS
7.5000 mg | ORAL_TABLET | Freq: Once | ORAL | Status: AC
  Administered 2012-06-03: 7.5 mg via ORAL
  Filled 2012-06-03: qty 1

## 2012-06-03 MED ORDER — HEPARIN (PORCINE) IN NACL 100-0.45 UNIT/ML-% IJ SOLN
1150.0000 [IU]/h | INTRAMUSCULAR | Status: DC
  Administered 2012-06-03 – 2012-06-04 (×2): 1000 [IU]/h via INTRAVENOUS
  Administered 2012-06-05 – 2012-06-07 (×5): 1150 [IU]/h via INTRAVENOUS
  Filled 2012-06-03 (×7): qty 250

## 2012-06-03 MED ORDER — IBUPROFEN 800 MG PO TABS: 800.0000 mg | ORAL_TABLET | Freq: Four times a day (QID) | ORAL | Status: DC | PRN

## 2012-06-03 NOTE — Progress Notes (Signed)
ANTICOAGULATION CONSULT NOTE - Initial Consult  Pharmacy Consult for Heparin and Coumadin Indication: PE,   Allergies  Allergen Reactions  . Morphine And Related Itching    Severe itching  . Percocet (Oxycodone-Acetaminophen) Nausea And Vomiting    Severe n/v  . Stadol (Butorphanol) Other (See Comments)    hallucinations  . Latex Rash  . Sulfa Antibiotics Rash    Patient Measurements: Height: 5' 4.5" (163.8 cm) Weight: 141 lb 9.6 oz (64.229 kg) IBW/kg (Calculated) : 55.85   Vital Signs: Temp: 97.3 F (36.3 C) (09/25 1733) Temp src: Oral (09/25 1733) BP: 119/70 mmHg (09/25 1733) Pulse Rate: 84  (09/25 1733)  Labs:  Basename 06/03/12 1700  HGB 11.3*  HCT 33.6*  PLT 388  APTT 33  LABPROT 14.1  INR 1.10  HEPARINUNFRC --  CREATININE --  CKTOTAL --  CKMB --  TROPONINI --    Estimated Creatinine Clearance: 80 ml/min (by C-G formula based on Cr of 0.67).   Medical History: Past Medical History  Diagnosis Date  . PONV (postoperative nausea and vomiting)   . No pertinent past medical history     Medications:    Assessment: 41yo F s/p robotic hysterectomy and bilateral salpingectomy 12 days ago admitted with fever, pain, vag bleeding. Per CT,  Right PE and Right gonadal vein Thrombosis detected. MD requested unfractionated heparin in case pt would need follow up procedure in OR. Goal of Therapy:  INR 2-3 and Heparin level 0.3-0.7. Monitor platelets by anticoagulation protocol: Yes   Plan:  1. Heparin bolus 4000 units x 1 (60 units/kg) then initiate drip at 1000 units/hr (16 units/kg/hr). 2. Draw Heparin level in 6 hr and adjust to maintain level at 0.3-0.7.  3. Follow H&H and platelets. 4. Coumadin 7.5mg  po x 1.. 5. Daily INR. Will complete Coumadin education prior to d/c. Thanks! Claybon Jabs 06/03/2012,8:15 PM

## 2012-06-03 NOTE — Progress Notes (Signed)
Bilateral:  No evidence of DVT, superficial thrombosis, or Baker's Cyst.   

## 2012-06-04 DIAGNOSIS — I809 Phlebitis and thrombophlebitis of unspecified site: Secondary | ICD-10-CM

## 2012-06-04 DIAGNOSIS — I2699 Other pulmonary embolism without acute cor pulmonale: Secondary | ICD-10-CM | POA: Diagnosis present

## 2012-06-04 DIAGNOSIS — D259 Leiomyoma of uterus, unspecified: Secondary | ICD-10-CM | POA: Diagnosis not present

## 2012-06-04 LAB — HEPARIN LEVEL (UNFRACTIONATED): Heparin Unfractionated: 0.35 IU/mL (ref 0.30–0.70)

## 2012-06-04 LAB — FACTOR 5 LEIDEN

## 2012-06-04 LAB — CBC
HCT: 30.8 % — ABNORMAL LOW (ref 36.0–46.0)
Hemoglobin: 10.3 g/dL — ABNORMAL LOW (ref 12.0–15.0)
MCH: 31.3 pg (ref 26.0–34.0)
MCHC: 33.4 g/dL (ref 30.0–36.0)
MCV: 93.6 fL (ref 78.0–100.0)
Platelets: 345 10*3/uL (ref 150–400)
RBC: 3.29 MIL/uL — ABNORMAL LOW (ref 3.87–5.11)
RDW: 12.1 % (ref 11.5–15.5)
WBC: 10.5 10*3/uL (ref 4.0–10.5)

## 2012-06-04 LAB — LUPUS ANTICOAGULANT PANEL
DRVVT: 47 secs — ABNORMAL HIGH (ref <45.1)
Lupus Anticoagulant: NOT DETECTED
PTT Lupus Anticoagulant: 39.4 secs (ref 28.0–43.0)
dRVVT Incubated 1:1 Mix: 41.9 secs (ref <45.1)

## 2012-06-04 LAB — MTHFR MUTATION DETECTION

## 2012-06-04 LAB — PROTIME-INR
INR: 1.05 (ref 0.00–1.49)
Prothrombin Time: 13.6 seconds (ref 11.6–15.2)

## 2012-06-04 MED ORDER — WARFARIN SODIUM 7.5 MG PO TABS
7.5000 mg | ORAL_TABLET | Freq: Once | ORAL | Status: AC
  Administered 2012-06-04: 7.5 mg via ORAL
  Filled 2012-06-04: qty 1

## 2012-06-04 MED ORDER — SODIUM CHLORIDE 0.9 % IV SOLN
INTRAVENOUS | Status: DC
  Administered 2012-06-04 – 2012-06-07 (×5): via INTRAVENOUS

## 2012-06-04 MED ORDER — DEXTROSE 5 % IV SOLN
1.0000 g | INTRAVENOUS | Status: DC
  Administered 2012-06-04 – 2012-06-05 (×2): 1 g via INTRAVENOUS
  Filled 2012-06-04 (×3): qty 10

## 2012-06-04 MED ORDER — METRONIDAZOLE 500 MG PO TABS
500.0000 mg | ORAL_TABLET | Freq: Three times a day (TID) | ORAL | Status: DC
  Administered 2012-06-04 – 2012-06-06 (×6): 500 mg via ORAL
  Filled 2012-06-04 (×7): qty 1

## 2012-06-04 MED ORDER — SODIUM CHLORIDE 0.9 % IV SOLN
1.5000 g | Freq: Four times a day (QID) | INTRAVENOUS | Status: DC
  Filled 2012-06-04 (×2): qty 1.5

## 2012-06-04 NOTE — Progress Notes (Signed)
Pt reports 'dark urine' at 0930. 250 cc Urine amber c brownish/red sediments.  Next 3 voids clear straw colored urine. At 1800 pt reports void c reddish color.  Similar to the earlier episode it was amber c reddish color, no sediments this time.  I informed  Dr cousins who was on unit, she examined pt, and cleared scant amount of dark reddish drainage from vagina.  Peripad was placed.  Pt resting comfortably after, She ambulated throughout unit at 1845 and reports no bleeding on pad.  Susie Cassette RN

## 2012-06-04 NOTE — Progress Notes (Signed)
POD # 13 daVinci robotic TLH bilateral salpingectomy  S: c/o headache. ( noted from yesterday). No assoc visual aura Had chilly feeling last night but not as previous nights Reviewed CT scan results with pt  O: VS T98.4  BP 113/64 P76   Lungs clear to A COR: RRR Abd: soft non distended active BS. Incisions well approximated.  Pad none Extr: no calf tenderness or edema  CBC:  Wbc 10.5 hgb 10.3 plt 345K  ucx ( office ) neg  BC x2 pending. Thrombophilic panel pending  IMP: Pulmonary embolism on IV heparin/coumadin po Septic pelvic thrombophlebitis P) await ID consult. Watch for bleeding given recent postop. Heparin level pending

## 2012-06-04 NOTE — Progress Notes (Signed)
UR Chart review completed.  

## 2012-06-04 NOTE — Progress Notes (Addendum)
Pt in good spirits.  Started on  IV ceftriaxone and oral Flagyl. ID consult note reviewed and appreciated  Had episode of chills today x 2 w/  Low grade temp noted  Pt also noted two episode of vaginal bleeding when void earlier. No pad   Heparin level 0.35  A/P Postop hysterectomy  P) to notify me of vaginal bleeding given anticoagulation. Will confer with pharm regarding heparin level and need to keep it low nl level while achieving coumadin range   Pt noted again urine colored w/ blood but no actual drainage from vagina.   Speculum exam done: vag  cuff approximated. Dark red nonmalodorous blood noted~ 10 cc . No active drainage noted.possible drainage of collection from cuff.

## 2012-06-04 NOTE — Consult Note (Addendum)
[facility]  Augmentin September 17-25       Reason for Consult: Postoperative septic pelvic vein thrombophlebitis complicated by pulmonary thromboembolism    Referring Physician: Dr. Maxie Better  Principal Problem:  *Thrombophlebitis - postoperative Active Problems:  PE (pulmonary thromboembolism)  Uterine fibroid      . heparin  4,000 Units Intravenous Once  . warfarin  7.5 mg Oral Once  . Warfarin - Pharmacist Dosing Inpatient   Does not apply q1800    Recommendations: 1. Start Unasyn      SEE ADDENDUM  2. 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 She has also had a pulmonary embolus. She did not have a good clinical response to oral Augmentin but that was probably the cause of her clot burden and she seems to be improving with the initial anticoagulation. I think the coverage of Augmentin is still reasonable pending the results of blood cultures. I will start IV Unasyn now. I will followup tomorrow morning.   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 She did well initially but started having fever, chills and drenching sweats about 4 days postoperative. She had no problems with her incisions. She was started on empiric Augmentin for possible vaginal cuff cellulitis but continued to have fever, chills, sweats. She started having some chest pain and became increasingly concerned leading to CT scans of the chest abdomen and pelvis. She was found to have a little bit of fluid anterior to her vaginal cuff but no definite abscess. She was also found to have a left gonadal vein thrombosis and right-sided pulmonary emboli leading to her admission last night. She was started on heparin and already feels like she is feeling  better. She has had a little bit of dry cough but no shortness of breath. She has little bit of abdominal bloating but no nausea, vomiting or diarrhea. She has not had any dysuria. She has not had any obvious swelling or pain in her legs.   Review of Systems: Pertinent items are noted in HPI.  Past Medical History  Diagnosis Date  . PONV (postoperative nausea and vomiting)   . No pertinent past medical history     History  Substance Use Topics  . Smoking status: Never Smoker   . Smokeless tobacco: Never Used  . Alcohol Use: Yes     occasional    Family History  Problem Relation Age of Onset  . Other Neg Hx    Allergies  Allergen Reactions  . Morphine And Related Itching    Severe itching  . Percocet (Oxycodone-Acetaminophen) Nausea And Vomiting    Severe n/v  . Stadol (Butorphanol) Other (See Comments)    hallucinations  . Latex Rash  . Sulfa Antibiotics Rash    OBJECTIVE: Blood pressure 113/64, pulse 76, temperature 98.4 F (36.9 C), temperature source Oral, resp. rate 16, height 5' 4.5" (1.638 m), weight 64.229 kg (141 lb 9.6 oz), SpO2 100.00%. General: Appears healthy and in good spirits. She is in no distress.  Skin:  No rash Lungs: Clear  Cor: Regular S1 and S2 no murmurs  Abdomen: Soft and nontender with quiet bowel sounds. A laparoscopic incision sites look good. Joints and extremities: No abnormalities  Microbiology: No results found for this or any previous visit (from the past 240 hour(s)).  Cliffton Asters, MD [facility] [phone] pager   [phone] cell 06/04/2012, 8:46 AM   Addendum: Unasyn is incompatible with heparin and it was hard to establish her current IV access yesterday so I will change Unasyn to IV ceftriaxone and oral metronidazole to avoid a second IV.  Cliffton Asters, MD [facility] [phone] pager   [phone] cell 06/04/2012, 2:34 PM

## 2012-06-04 NOTE — H&P (Signed)
Julie Johns is an 41 y.o. female. G2P2 MBF S/P daVinci robotic TLH, bilateral salpingectomy(9/12) now admitted for management of newly diagnosed PE and left gonadal vein thrombosis. Pt had CT scan done today due to intermittent fever and feeling chilled to the bone over past several days. Pt also noted achy arms and legs and complained of drenching night sweats. Pt was seen in office yesterday where cbc done showed wbc 15.4K, pelvic sonogram was normal, urine culture sent and no discrete source on clinical exam. Pt has had no abdominal pain or urinary sx. Pt was seen on POD #4 for c/o abdominal pain. She had CT scan done which showed stool and postoperative changes. At that time, she had wbc 13K ( down from 17K on day of discharge), nl BMP and was placed on empiric augmentin. Pt had above complaints despite antibiotics. Pt received flu vaccine(9/12) prior to d/c from surgery> It was unclear whether some of her initial symptoms was related to flu vaccine reaction.   Pertinent Gynecological History: Menses: hysterectomy Bleeding: s/p endom ablation Contraception: status post hysterectomy DES exposure: denies Blood transfusions: none Sexually transmitted diseases: no past history Previous GYN Procedures: myomectomy, hysterectomy  Endometrial ablation Last mammogram: normal Date: 2013 Last pap: normal Date: 2013 OB History: G2 P2  Menstrual History: Menarche age: n/a No LMP recorded. Patient has had a hysterectomy.    Past Medical History  Diagnosis Date  . PONV (postoperative nausea and vomiting)   . No pertinent past medical history     Past Surgical History  Procedure Date  . Lasik   . Myomectomy 2000  . Tubal ligation 2005  . Mouth surgery   . Cesarean section 2001, 2005  . Tubal ligation   . Novasure ablation     Family History  Problem Relation Age of Onset  . Other Neg Hx     Social History:  reports that she has never smoked. She has never used smokeless tobacco. She  reports that she drinks alcohol. She reports that she does not use illicit drugs.  Allergies:  Allergies  Allergen Reactions  . Morphine And Related Itching    Severe itching  . Percocet (Oxycodone-Acetaminophen) Nausea And Vomiting    Severe n/v  . Stadol (Butorphanol) Other (See Comments)    hallucinations  . Latex Rash  . Sulfa Antibiotics Rash    Prescriptions prior to admission  Medication Sig Dispense Refill  . amoxicillin-clavulanate (AUGMENTIN) 875-125 MG per tablet Take 1 tablet by mouth every 12 (twelve) hours.  14 tablet  0  . bisacodyl (DULCOLAX) 10 MG suppository Place 10 mg rectally as needed.      Marland Kitchen HYDROmorphone (DILAUDID) 2 MG tablet One to two tablet every 4-6 hours prn pain  30 tablet  0  . ibuprofen (ADVIL,MOTRIN) 800 MG tablet Take 800 mg by mouth every 8 (eight) hours as needed. Pain all over      . magnesium citrate SOLN Take 1 Bottle by mouth once.        ROSsee HPI  Blood pressure 116/75, pulse 66, temperature 98.3 F (36.8 C), temperature source Oral, resp. rate 16, height 5' 4.5" (1.638 m), weight 64.229 kg (141 lb 9.6 oz), SpO2 100.00%. Physical Exam  Constitutional: She is oriented to person, place, and time. She appears well-developed and well-nourished.  HENT:  Head: Normocephalic.  Eyes: EOM are normal.  Neck: Neck supple.  Cardiovascular: Normal rate and regular rhythm.   Respiratory: Breath sounds normal.  GI: Soft. Bowel sounds are  normal. She exhibits no mass. There is tenderness. There is no rebound and no guarding.  Musculoskeletal: Normal range of motion.  Neurological: She is alert and oriented to person, place, and time.  Skin: Skin is warm and dry.  Psychiatric: She has a normal mood and affect.  Pelvic deferred today. Done yesterday in office. Vaginal cuff healing well scant pinkish d/c w/o odor. Min tender on left side w/o palp mass  Results for orders placed during the hospital encounter of 06/03/12 (from the past 24 hour(s))    PROTIME-INR     Status: Normal   Collection Time   06/03/12  5:00 PM      Component Value Range   Prothrombin Time 14.1  11.6 - 15.2 seconds   INR 1.10  0.00 - 1.49  APTT     Status: Normal   Collection Time   06/03/12  5:00 PM      Component Value Range   aPTT 33  24 - 37 seconds  ANTITHROMBIN III     Status: Normal   Collection Time   06/03/12  5:00 PM      Component Value Range   AntiThromb III Func 90  75 - 120 %  CBC WITH DIFFERENTIAL     Status: Abnormal   Collection Time   06/03/12  5:00 PM      Component Value Range   WBC 15.4 (*) 4.0 - 10.5 K/uL   RBC 3.57 (*) 3.87 - 5.11 MIL/uL   Hemoglobin 11.3 (*) 12.0 - 15.0 g/dL   HCT 16.1 (*) 09.6 - 04.5 %   MCV 94.1  78.0 - 100.0 fL   MCH 31.7  26.0 - 34.0 pg   MCHC 33.6  30.0 - 36.0 g/dL   RDW 40.9  81.1 - 91.4 %   Platelets 388  150 - 400 K/uL   Neutrophils Relative 80 (*) 43 - 77 %   Neutro Abs 12.4 (*) 1.7 - 7.7 K/uL   Lymphocytes Relative 11 (*) 12 - 46 %   Lymphs Abs 1.7  0.7 - 4.0 K/uL   Monocytes Relative 8  3 - 12 %   Monocytes Absolute 1.3 (*) 0.1 - 1.0 K/uL   Eosinophils Relative 0  0 - 5 %   Eosinophils Absolute 0.1  0.0 - 0.7 K/uL   Basophils Relative 0  0 - 1 %   Basophils Absolute 0.1  0.0 - 0.1 K/uL    Ct Angio Chest Pe W/cm &/or Wo Cm  06/03/2012  **ADDENDUM** CREATED: 06/03/2012 14:33:38  Critical Value/emergent results were called by telephone at the time of interpretation on 06/03/2012 at 1420 hours to Dr. Cherly Hensen, who verbally acknowledged these results.  **END ADDENDUM** SIGNED BY: Charline Bills, M.D.   06/03/2012  *RADIOLOGY REPORT*  Clinical Data: Chest pain, recent hysterectomy, evaluate for PE  CT ANGIOGRAPHY CHEST  Technique:  Multidetector CT imaging of the chest using the standard protocol during bolus administration of intravenous contrast. Multiplanar reconstructed images including MIPs were obtained and reviewed to evaluate the vascular anatomy.  Contrast: OMNIPAQUE IOHEXOL 350 MG/ML  SOLN  Comparison: Chest radiographs dated 01/02/2004  Findings: Segmental pulmonary embolus within a branch of the right lower lobe pulmonary artery (series 6/image 150).  Additional possible tiny subsegmental pulmonary emboli within two branches of the right upper lobe pulmonary artery (series 6/images 92 and 97). Overall clot burden is small to moderate.  Lungs are clear.  No pulmonary nodules. No pleural effusion or pneumothorax.  Visualized thyroid is unremarkable.  The heart is normal in size.  No pericardial effusion.  No suspicious mediastinal, hilar, or axillary lymphadenopathy.  Visualized upper abdomen is unremarkable.  IMPRESSION: Segmental pulmonary embolus within a branch of the right lower lobe pulmonary artery.  Additional possible tiny subsegmental pulmonary emboli within the right upper lobe pulmonary artery.  Overall clot burden is small to moderate.   Original Report Authenticated By: Charline Bills, M.D.    Ct Pelvis W Contrast  06/03/2012  *RADIOLOGY REPORT*  Clinical Data:  Status post hysterectomy 12 days ago, fever, pain, vaginal bleeding, evaluate for abscess  CT PELVIS WITH CONTRAST  Technique:  Multidetector CT imaging of the pelvis was performed using the standard protocol following the bolus administration of intravenous contrast.  Contrast:   125 ml Omnipaque-300 IV  Comparison:  CT abdomen pelvis dated 05/26/2012  Findings:  Status post hysterectomy.  Small amount of fluid anterior to the vaginal cuff, more well defined on the right, measuring approximately 1.2 x 2.3 cm (series 4/image 138).  While this could simply reflect postsurgical fluid/seroma, an early/developing abscess is possible, although it remains likely nondrainable due to small size.  Bilateral ovaries are unremarkable.  However, there is a filling defect within the left gonadal vein (series 4/image 15), incompletely visualized, reflecting gonadal vein thrombosis.  Bladder is within normal limits.  Visualized bowel  is unremarkable.  Normal appendix.  Visualized osseous structures are within normal limits.  IMPRESSION: Status post hysterectomy.  Small amount of postsurgical fluid anterior to the vaginal cuff, including a possible 1.2 x 2.3 cm early/developing abscess on the right, although likely nondrainable due to small size.  Left gonadal vein thrombosis, incompletely visualized.  These results were called by telephone on 06/03/2012 at 1420 hours to Dr. Cherly Hensen, who verbally acknowledged these results.   Original Report Authenticated By: Charline Bills, M.D.     Assessment/Plan: POD #12 s/p hysterectomy Pulmonary embolism Left ovarian vein thrombosis ?septic pelvic thrombophlebitis P) admit. IV heparin( due to recent surgery and ability to reverse ). Coumadin. ID consult. Recommend blood cultures. Defer antibiotics and watch fever curve, thrombophilic panel. Dopplers of legs. SCD hose   Tierre Gerard A 06/04/2012, 3:29 AM

## 2012-06-04 NOTE — Progress Notes (Addendum)
ANTICOAGULATION CONSULT NOTE - Follow Up Consult  Pharmacy Consult for heparin gtt and Coumadin Indication: pulmonary embolus, right gonadal vein thrombosis  Allergies  Allergen Reactions  . Morphine And Related Itching    Severe itching  . Percocet (Oxycodone-Acetaminophen) Nausea And Vomiting    Severe n/v  . Stadol (Butorphanol) Other (See Comments)    hallucinations  . Latex Rash  . Sulfa Antibiotics Rash    Patient Measurements: Height: 5' 4.5" (163.8 cm) Weight: 141 lb 9.6 oz (64.229 kg) IBW/kg (Calculated) : 55.85    Vital Signs: Temp: 98.9 F (37.2 C) (09/26 0935) Temp src: Oral (09/26 0935) BP: 122/72 mmHg (09/26 0935) Pulse Rate: 96  (09/26 0935)  Labs:  Basename 06/04/12 0540 06/03/12 1700  HGB 10.3* 11.3*  HCT 30.8* 33.6*  PLT 345 388  APTT -- 33  LABPROT 13.6 14.1  INR 1.05 1.10  HEPARINUNFRC 0.35 --  CREATININE -- --  CKTOTAL -- --  CKMB -- --  TROPONINI -- --    Estimated Creatinine Clearance: 80 ml/min (by C-G formula based on Cr of 0.67).   Medications:  Scheduled:    . ampicillin-sulbactam (UNASYN) IV  1.5 g Intravenous Q6H  . heparin  4,000 Units Intravenous Once  . warfarin  7.5 mg Oral Once  . warfarin  7.5 mg Oral ONCE-1800  . Warfarin - Pharmacist Dosing Inpatient   Does not apply q1800    Assessment: 41 yo F S/p robotic hysterectomy and bilateral salpingectomy 13 days ago.  CT on 9/25 showed right PE and right gonadal vein thrombosis.   Heparin level this Am = 0.35 IU/mL   - within goal [Of note- heparin level sample lost in transit to [facility] this AM- repeat was drawn] INR= 1.1 --> 1.05 this AM  - not at goal  Goal of Therapy:  Heparin level 0.3-0.7 units/ml INR goal 2-3 Monitor platelets by anticoagulation protocol: Yes   Plan:  1.  Heparin level within goal, Continue heparin gtt at current rate.  2.  Will draw heparin level in AM and adjust if necessary.  3.  Follow H& H and platelets 4.  Coumadin 7.5mg  today at 1800 x 1    5.  Daily INR ordered, will evaluate tomorrow AM.  6.  Because Unasyn is not compatible with Heparin, Dr. Orvan Falconer changed Abx to ceftriaxone and metronidazole.       Spoke with RN about changing LR fluid to NS for hanging ceftriaxone.  Also, there is a drug-drug interaction with metronidazole and Coumadin.  Will monitor closely.     Thank you,  Andrey Campanile, Kiah Vanalstine Scarlett 06/04/2012,10:35 AM

## 2012-06-05 LAB — CBC
HCT: 28.7 % — ABNORMAL LOW (ref 36.0–46.0)
Hemoglobin: 9.8 g/dL — ABNORMAL LOW (ref 12.0–15.0)
MCH: 31.9 pg (ref 26.0–34.0)
MCHC: 34.1 g/dL (ref 30.0–36.0)
MCV: 93.5 fL (ref 78.0–100.0)
Platelets: 330 10*3/uL (ref 150–400)
RBC: 3.07 MIL/uL — ABNORMAL LOW (ref 3.87–5.11)
RDW: 12 % (ref 11.5–15.5)
WBC: 10.4 10*3/uL (ref 4.0–10.5)

## 2012-06-05 LAB — BETA-2-GLYCOPROTEIN I ABS, IGG/M/A
Beta-2 Glyco I IgG: 0 G Units (ref <20)
Beta-2-Glycoprotein I IgA: 6 A Units (ref <20)
Beta-2-Glycoprotein I IgM: 2 M Units (ref <20)

## 2012-06-05 LAB — CARDIOLIPIN ANTIBODIES, IGM+IGG
Anticardiolipin IgG: 22 GPL U/mL (ref <23)
Anticardiolipin IgM: 8 MPL U/mL — ABNORMAL LOW (ref <11)

## 2012-06-05 LAB — HEPARIN LEVEL (UNFRACTIONATED)
Heparin Unfractionated: 0.21 IU/mL — ABNORMAL LOW (ref 0.30–0.70)
Heparin Unfractionated: 0.57 IU/mL (ref 0.30–0.70)

## 2012-06-05 LAB — PROTEIN C, TOTAL: Protein C, Total: 81 % (ref 72–160)

## 2012-06-05 LAB — PROTIME-INR
INR: 1.14 (ref 0.00–1.49)
Prothrombin Time: 14.4 seconds (ref 11.6–15.2)

## 2012-06-05 LAB — PROTHROMBIN GENE MUTATION

## 2012-06-05 LAB — PROTEIN S, TOTAL: Protein S Ag, Total: 96 % (ref 60–150)

## 2012-06-05 MED ORDER — POLYSACCHARIDE IRON COMPLEX 150 MG PO CAPS
150.0000 mg | ORAL_CAPSULE | Freq: Two times a day (BID) | ORAL | Status: DC
  Administered 2012-06-05 – 2012-06-07 (×4): 150 mg via ORAL
  Filled 2012-06-05 (×9): qty 1

## 2012-06-05 MED ORDER — WARFARIN SODIUM 10 MG PO TABS
10.0000 mg | ORAL_TABLET | Freq: Once | ORAL | Status: AC
  Administered 2012-06-05: 10 mg via ORAL
  Filled 2012-06-05: qty 1

## 2012-06-05 MED ORDER — HEPARIN BOLUS VIA INFUSION
1000.0000 [IU] | Freq: Once | INTRAVENOUS | Status: AC
  Administered 2012-06-05: 1000 [IU] via INTRAVENOUS
  Filled 2012-06-05: qty 1000

## 2012-06-05 NOTE — Progress Notes (Addendum)
ANTICOAGULATION CONSULT NOTE - Follow Up Consult  Pharmacy Consult for anticoagulation Indication: pulmonary embolus  Allergies  Allergen Reactions  . Morphine And Related Itching    Severe itching  . Percocet (Oxycodone-Acetaminophen) Nausea And Vomiting    Severe n/v  . Stadol (Butorphanol) Other (See Comments)    hallucinations  . Latex Rash  . Sulfa Antibiotics Rash    Patient Measurements: Height: 5' 4.5" (163.8 cm) Weight: 141 lb 9.6 oz (64.229 kg) IBW/kg (Calculated) : 55.85    Vital Signs: Temp: 98.2 F (36.8 C) (09/27 0938) Temp src: Oral (09/27 0938) BP: 114/73 mmHg (09/27 0938) Pulse Rate: 89  (09/27 0938)  Labs:  Basename 06/05/12 0500 06/04/12 0540 06/03/12 1700  HGB 9.8* 10.3* --  HCT 28.7* 30.8* 33.6*  PLT 330 345 388  APTT -- -- 33  LABPROT 14.4 13.6 14.1  INR 1.14 1.05 1.10  HEPARINUNFRC 0.21* 0.35 --  CREATININE -- -- --  CKTOTAL -- -- --  CKMB -- -- --  TROPONINI -- -- --    Estimated Creatinine Clearance: 80 ml/min (by C-G formula based on Cr of 0.67).   Medications:  Scheduled:    . cefTRIAXone (ROCEPHIN)  IV  1 g Intravenous Q24H  . heparin  1,000 Units Intravenous Once  . iron polysaccharides  150 mg Oral BID  . metroNIDAZOLE  500 mg Oral Q8H  . warfarin  10 mg Oral ONCE-1800  . warfarin  7.5 mg Oral ONCE-1800  . Warfarin - Pharmacist Dosing Inpatient   Does not apply q1800  . DISCONTD: ampicillin-sulbactam (UNASYN) IV  1.5 g Intravenous Q6H    Assessment: Pt feeling better today.  Heparin level low= 0.21.  INR low= 1.14.   H/H and plts WNL  Goal of Therapy:  Heparin level 0.3-0.7 units/ml INR 2-3 Monitor platelets by anticoagulation protocol: Yes   Plan:  1.  Heparin Bolus of 15 units/kg this AM  2. Increase Heparin drip by 2 units/kg/hour 3.  UFH level at 1400 (6 hours after bolus and increase) 4.  Coumadin 10mg  today at 1800 5.  Continue daily INR, CBC, and heparin levels.   6.  Completed Coumadin teaching with Dr.  Creta Levin, provided handout.    Isaias Sakai [facility] 06/05/2012  Heparin level at 1422=0.57 units.ml, Will continue current Heparin rate. Will check Heparin level in am and adjust accordingly to maintain level 0.3-0.7. Discussed Heparin vs. Lovenox treatment with Dr. Cherly Hensen and she prefers to continue Heparin at this point. Heparin will need to be continued until INR therapeutic and pt can be transitioned. Thanks! Shanon Ace, RPh

## 2012-06-05 NOTE — Progress Notes (Signed)
S: had episode of left upper back pain last night much better this am and after use of ultram Also notes another episode of vaginal bleeding postvoid followed by clear urine. Still no blood on pad. Mild left lower quad pain. (+) fever episode but not as much sweat  VS: Tmax 100.3  Now 98.4 BP 122/74  Lungs clear to A Cor RRR grade II/VI SEM Abd: soft mild tender left lower quad w/o rebound active bowel sounds Pad no staining Ext: no calf tenderness or edema  Labs: lupus anticoag neg/. BC pending  hgb 9.8 hct 28.7 wbc 10.4 plt 330  INR 1.14 PTT 14.4  Heparin 0.21  IMP: Septic pelvic thrombophlebitis on IV ceftriaxone, flagyl Pulmonary Embolism   On IV heparin/ coumadin Not therapeutic as yet Anemia   POD #14 s/p daVinci total hysterectomy, bilateral salpingectomy P) cont antibiotic. anticoag mgmt per pharm. Await labs. Start iron

## 2012-06-05 NOTE — Progress Notes (Signed)
Patient ID: Julie Johns, female   DOB: 12-09-1970, 41 y.o.   MRN: [id number]    [facility] for Infectious Disease    Date of Admission:  06/03/2012   Total days of antibiotics 10        Day 2 ceftriaxone and metronidazole         Principal Problem:  *Thrombophlebitis - postoperative Active Problems:  PE (pulmonary thromboembolism)  Uterine fibroid      . cefTRIAXone (ROCEPHIN)  IV  1 g Intravenous Q24H  . heparin  1,000 Units Intravenous Once  . metroNIDAZOLE  500 mg Oral Q8H  . warfarin  7.5 mg Oral ONCE-1800  . Warfarin - Pharmacist Dosing Inpatient   Does not apply q1800  . DISCONTD: ampicillin-sulbactam (UNASYN) IV  1.5 g Intravenous Q6H    Subjective: She's had some intermittent vaginal bleeding. Has also had some intermittent pleuritic chest pain, left lower quadrant pain and a few episodes of mild chills and sweats. However, she states that she is feeling remarkably better over the last 48 hours.  Objective: Temp:  [98.3 F (36.8 C)-100.3 F (37.9 C)] 98.4 F (36.9 C) (09/27 0554) Pulse Rate:  [79-96] 79  (09/27 0554) Resp:  [17-20] 18  (09/27 0554) BP: (107-127)/(64-74) 127/74 mmHg (09/27 0554) SpO2:  [96 %-100 %] 96 % (09/27 0554)  General:  Alert, comfortable and in good spirits Skin: No rash Lungs: Clear Cor: Regular S1 and S2 with no murmurs Abdomen: Soft with mild left lower quadrant tenderness. Her surgical incisions look good  Lab Results Lab Results  Component Value Date   WBC 10.4 06/05/2012   HGB 9.8* 06/05/2012   HCT 28.7* 06/05/2012   MCV 93.5 06/05/2012   PLT 330 06/05/2012    Lab Results  Component Value Date   CREATININE 0.67 05/26/2012   BUN 11 05/26/2012   NA 137 05/26/2012   K 3.9 05/26/2012   CL 99 05/26/2012   CO2 30 05/26/2012    No results found for this basename: ALT, AST, GGT, ALKPHOS, BILITOT      Microbiology: No results found for this or any previous visit (from the past 240 hour(s)).  Studies/Results: Ct Angio Chest  Pe W/cm &/or Wo Cm  06/03/2012  **ADDENDUM** CREATED: 06/03/2012 14:33:38  Critical Value/emergent results were called by telephone at the time of interpretation on 06/03/2012 at 1420 hours to Dr. Cherly Hensen, who verbally acknowledged these results.  **END ADDENDUM** SIGNED BY: Charline Bills, M.D.   06/03/2012  *RADIOLOGY REPORT*  Clinical Data: Chest pain, recent hysterectomy, evaluate for PE  CT ANGIOGRAPHY CHEST  Technique:  Multidetector CT imaging of the chest using the standard protocol during bolus administration of intravenous contrast. Multiplanar reconstructed images including MIPs were obtained and reviewed to evaluate the vascular anatomy.  Contrast: OMNIPAQUE IOHEXOL 350 MG/ML SOLN  Comparison: Chest radiographs dated 01/02/2004  Findings: Segmental pulmonary embolus within a branch of the right lower lobe pulmonary artery (series 6/image 150).  Additional possible tiny subsegmental pulmonary emboli within two branches of the right upper lobe pulmonary artery (series 6/images 92 and 97). Overall clot burden is small to moderate.  Lungs are clear.  No pulmonary nodules. No pleural effusion or pneumothorax.  Visualized thyroid is unremarkable.  The heart is normal in size.  No pericardial effusion.  No suspicious mediastinal, hilar, or axillary lymphadenopathy.  Visualized upper abdomen is unremarkable.  IMPRESSION: Segmental pulmonary embolus within a branch of the right lower lobe pulmonary artery.  Additional possible tiny subsegmental  pulmonary emboli within the right upper lobe pulmonary artery.  Overall clot burden is small to moderate.   Original Report Authenticated By: Charline Bills, M.D.    Ct Pelvis W Contrast  06/03/2012  *RADIOLOGY REPORT*  Clinical Data:  Status post hysterectomy 12 days ago, fever, pain, vaginal bleeding, evaluate for abscess  CT PELVIS WITH CONTRAST  Technique:  Multidetector CT imaging of the pelvis was performed using the standard protocol following the  bolus administration of intravenous contrast.  Contrast:   125 ml Omnipaque-300 IV  Comparison:  CT abdomen pelvis dated 05/26/2012  Findings:  Status post hysterectomy.  Small amount of fluid anterior to the vaginal cuff, more well defined on the right, measuring approximately 1.2 x 2.3 cm (series 4/image 138).  While this could simply reflect postsurgical fluid/seroma, an early/developing abscess is possible, although it remains likely nondrainable due to small size.  Bilateral ovaries are unremarkable.  However, there is a filling defect within the left gonadal vein (series 4/image 15), incompletely visualized, reflecting gonadal vein thrombosis.  Bladder is within normal limits.  Visualized bowel is unremarkable.  Normal appendix.  Visualized osseous structures are within normal limits.  IMPRESSION: Status post hysterectomy.  Small amount of postsurgical fluid anterior to the vaginal cuff, including a possible 1.2 x 2.3 cm early/developing abscess on the right, although likely nondrainable due to small size.  Left gonadal vein thrombosis, incompletely visualized.  These results were called by telephone on 06/03/2012 at 1420 hours to Dr. Cherly Hensen, who verbally acknowledged these results.   Original Report Authenticated By: Charline Bills, M.D.     Assessment: She is improving since the start of anticoagulation. I will continue her current antibiotic therapy pending further observation and final blood culture results.  Plan: 1. Continue ceftriaxone and metronidazole 2. Await final blood cultures 3. Dr. Gwen Her Dam [phone]) will follow with you this weekend  Cliffton Asters, MD [facility] for Infectious Disease [facility] [phone] pager   [phone] cell 06/05/2012, 7:55 AM

## 2012-06-06 ENCOUNTER — Encounter (HOSPITAL_COMMUNITY): Payer: Self-pay

## 2012-06-06 ENCOUNTER — Inpatient Hospital Stay (HOSPITAL_COMMUNITY): Payer: Commercial Managed Care - PPO

## 2012-06-06 LAB — CBC
HCT: 29.3 % — ABNORMAL LOW (ref 36.0–46.0)
Hemoglobin: 9.9 g/dL — ABNORMAL LOW (ref 12.0–15.0)
MCH: 31.5 pg (ref 26.0–34.0)
MCHC: 33.8 g/dL (ref 30.0–36.0)
MCV: 93.3 fL (ref 78.0–100.0)
Platelets: 365 10*3/uL (ref 150–400)
RBC: 3.14 MIL/uL — ABNORMAL LOW (ref 3.87–5.11)
RDW: 12 % (ref 11.5–15.5)
WBC: 10.3 10*3/uL (ref 4.0–10.5)

## 2012-06-06 LAB — PROTIME-INR
INR: 1.79 — ABNORMAL HIGH (ref 0.00–1.49)
Prothrombin Time: 20.2 seconds — ABNORMAL HIGH (ref 11.6–15.2)

## 2012-06-06 LAB — HEPARIN LEVEL (UNFRACTIONATED): Heparin Unfractionated: 0.44 IU/mL (ref 0.30–0.70)

## 2012-06-06 MED ORDER — WARFARIN SODIUM 5 MG PO TABS
5.0000 mg | ORAL_TABLET | Freq: Once | ORAL | Status: AC
  Administered 2012-06-06: 5 mg via ORAL
  Filled 2012-06-06: qty 1

## 2012-06-06 MED ORDER — LIDOCAINE HCL (CARDIAC) 20 MG/ML IV SOLN
INTRAVENOUS | Status: AC
  Filled 2012-06-06: qty 5

## 2012-06-06 MED ORDER — POLYETHYLENE GLYCOL 3350 17 G PO PACK
17.0000 g | PACK | Freq: Every day | ORAL | Status: DC
  Administered 2012-06-06 – 2012-06-07 (×2): 17 g via ORAL
  Filled 2012-06-06 (×4): qty 1

## 2012-06-06 MED ORDER — SODIUM CHLORIDE 0.9 % IV SOLN
1.0000 g | INTRAVENOUS | Status: DC
  Administered 2012-06-07 – 2012-06-08 (×2): 1 g via INTRAVENOUS
  Filled 2012-06-06 (×4): qty 1

## 2012-06-06 MED ORDER — ONDANSETRON HCL 4 MG PO TABS
8.0000 mg | ORAL_TABLET | Freq: Two times a day (BID) | ORAL | Status: DC | PRN
  Administered 2012-06-06 – 2012-06-07 (×3): 8 mg via ORAL
  Filled 2012-06-06 (×3): qty 2

## 2012-06-06 NOTE — Progress Notes (Signed)
S; no pain med today. No fever spike today Pt notes left side nontender to touch  O:VSS afebrile  urine w/ brighter red blood  IMP: PE anticoagulated on IV heparin and coumadin Septic pelvic thrombophlebitis on Invanz P) pelvic sono to assess for cuff  hematoma

## 2012-06-06 NOTE — Progress Notes (Signed)
Patient voided 350cc, urine is red in color, no sediment noted.  Abdomen is soft, denies tenderness.  MD notified.  No new orders at this time.  Will continue to monitor.  Osvaldo Angst, RN--------------------------

## 2012-06-06 NOTE — Progress Notes (Signed)
ANTICOAGULATION CONSULT NOTE - Follow Up Consult  Pharmacy Consult for Heparin & Coumadin Indication: pulmonary embolus  Allergies  Allergen Reactions  . Morphine And Related Itching    Severe itching  . Percocet (Oxycodone-Acetaminophen) Nausea And Vomiting    Severe n/v  . Stadol (Butorphanol) Other (See Comments)    hallucinations  . Latex Rash  . Sulfa Antibiotics Rash    Patient Measurements: Height: 5' 4.5" (163.8 cm) Weight: 141 lb 9.6 oz (64.229 kg) IBW/kg (Calculated) : 55.85    Vital Signs: Temp: 98.5 F (36.9 C) (09/28 0558) Temp src: Oral (09/28 0558) BP: 128/81 mmHg (09/28 0558) Pulse Rate: 72  (09/28 0558)  Labs:  Basename 06/06/12 0625 06/05/12 1422 06/05/12 0500 06/04/12 0540 06/03/12 1700  HGB 9.9* -- 9.8* -- --  HCT 29.3* -- 28.7* 30.8* --  PLT 365 -- 330 345 --  APTT -- -- -- -- 33  LABPROT 20.2* -- 14.4 13.6 --  INR 1.79* -- 1.14 1.05 --  HEPARINUNFRC 0.44 0.57 0.21* -- --  CREATININE -- -- -- -- --  CKTOTAL -- -- -- -- --  CKMB -- -- -- -- --  TROPONINI -- -- -- -- --    Estimated Creatinine Clearance: 80 ml/min (by C-G formula based on Cr of 0.67).   Medications:  Scheduled:    . cefTRIAXone (ROCEPHIN)  IV  1 g Intravenous Q24H  . iron polysaccharides  150 mg Oral BID  . metroNIDAZOLE  500 mg Oral Q8H  . warfarin  10 mg Oral ONCE-1800  . Warfarin - Pharmacist Dosing Inpatient   Does not apply q1800  Heparin drip infusing @ 1150 units/hr  Assessment: Heparin level is within the goal range.  The INR increased significantly to 1.79 after warfarin 7.5 mg - 7.5 mg - 10 mg.  Pt is on metronidazole which can increase warfarin's effects. Hgb / Hct are stable.  Platelet count = 365  Goal of Therapy:  INR 2-3 Heparin level 0.3-0.7 units/ml Monitor platelets by anticoagulation protocol: Yes   Plan:  Continue heparin drip @ 1150 units/hr. Warfarin 5 mg today. Daily heparin level, INR, & CBC.  Natasha Bence 06/06/2012,9:34 AM

## 2012-06-06 NOTE — Plan of Care (Signed)
Problem: Phase III Progression Outcomes Goal: Pain controlled on oral analgesia Outcome: Completed/Met Date Met:  06/06/12 Good pain control with po Ultram per patient Goal: Activity at appropriate level-compared to baseline (UP IN CHAIR FOR HEMODIALYSIS)  Outcome: Completed/Met Date Met:  06/06/12 Tolerates ambulating to bathroom and sitting up in chair without any SOB Goal: Discharge plan remains appropriate-arrangements made Outcome: Progressing INR needs to increase in order to D/C Heparin drip. Goal: Other Phase III Outcomes/Goals Outcome: Progressing Continue to monitor vaginal bleeding closely for hemorrhage.

## 2012-06-06 NOTE — Progress Notes (Signed)
[facility]  Total days of antibiotics 11 Day 3 ceftriaxone and metronidazole   Subjective: Had drenching sweats again last night w complaints  Antibiotics:  Anti-infectives     Start     Dose/Rate Route Frequency Ordered Stop   06/06/12 1100   ertapenem (INVANZ) 1 g in sodium chloride 0.9 % 50 mL IVPB        1 g 100 mL/hr over 30 Minutes Intravenous Every 24 hours 06/06/12 1037     06/04/12 1400   metroNIDAZOLE (FLAGYL) tablet 500 mg  Status:  Discontinued        500 mg Oral 3 times per day 06/04/12 1303 06/06/12 1037   06/04/12 1300   cefTRIAXone (ROCEPHIN) 1 g in dextrose 5 % 50 mL IVPB  Status:  Discontinued        1 g 100 mL/hr over 30 Minutes Intravenous Every 24 hours 06/04/12 1303 06/06/12 1037   06/04/12 0900   ampicillin-sulbactam (UNASYN) 1.5 g in sodium chloride 0.9 % 50 mL IVPB  Status:  Discontinued        1.5 g 100 mL/hr over 30 Minutes Intravenous Every 6 hours 06/04/12 0853 06/04/12 1208          Medications: Scheduled Meds:   . ertapenem  1 g Intravenous Q24H  . iron polysaccharides  150 mg Oral BID  . polyethylene glycol  17 g Oral Daily  . warfarin  10 mg Oral ONCE-1800  . warfarin  5 mg Oral ONCE-1800  . Warfarin - Pharmacist Dosing Inpatient   Does not apply q1800  . DISCONTD: cefTRIAXone (ROCEPHIN)  IV  1 g Intravenous Q24H  . DISCONTD: metroNIDAZOLE  500 mg Oral Q8H   Continuous Infusions:   . sodium chloride 50 mL/hr at 06/05/12 2008  . heparin 1,150 Units/hr (06/05/12 1530)  . lactated ringers 50 mL/hr at 06/03/12 1943   PRN Meds:.acetaminophen, ondansetron, traMADol   Objective: Weight change:   Intake/Output Summary (Last 24 hours) at 06/06/12 1046 Last data filed at 06/06/12 1000  Gross per 24 hour  Intake 3265.08 ml  Output   2100 ml  Net 1165.08 ml   Blood pressure 115/69, pulse 93, temperature 98.9 F (37.2 C), temperature source Oral, resp. rate 18, height 5' 4.5" (1.638 m), weight 141 lb  9.6 oz (64.229 kg), SpO2 98.00%. Temp:  [98.5 F (36.9 C)-99.9 F (37.7 C)] 98.9 F (37.2 C) (09/28 1000) Pulse Rate:  [72-93] 93  (09/28 1000) Resp:  [18-20] 18  (09/28 1000) BP: (115-128)/(69-81) 115/69 mmHg (09/28 1000) SpO2:  [97 %-100 %] 98 % (09/28 1000)  Physical Exam: General: Alert and awake, oriented x3, not in any acute distress. CVS regular rate, normal r,  no murmur rubs or gallops Abdomen: soft  Tender diffusely greater in LLQ, no rebound Extremities: no  clubbing or edema noted bilaterally Neuro: nonfocal  Lab Results:  Basename 06/06/12 0625 06/05/12 0500  WBC 10.3 10.4  HGB 9.9* 9.8*  HCT 29.3* 28.7*  PLT 365 330    BMET No results found for this basename: NA:2,K:2,CL:2,CO2:2,GLUCOSE:2,BUN:2,CREATININE:2,CALCIUM:2 in the last 72 hours  Micro Results: Recent Results (from the past 240 hour(s))  CULTURE, BLOOD (ROUTINE X 2)     Status: Normal (Preliminary result)   Collection Time   06/03/12  8:00 PM      Component Value Range Status Comment   Specimen Description BLOOD RIGHT HAND   Final    Special Requests BOTTLES DRAWN AEROBIC AND ANAEROBIC  12 ML   Final    Culture  Setup Time 06/04/2012 00:54   Final    Culture     Final    Value:        BLOOD CULTURE RECEIVED NO GROWTH TO DATE CULTURE WILL BE HELD FOR 5 DAYS BEFORE ISSUING A FINAL NEGATIVE REPORT   Report Status PENDING   Incomplete   CULTURE, BLOOD (ROUTINE X 2)     Status: Normal (Preliminary result)   Collection Time   06/03/12  8:05 PM      Component Value Range Status Comment   Specimen Description BLOOD LEFT HAND   Final    Special Requests BOTTLES DRAWN AEROBIC AND ANAEROBIC   Final    Culture  Setup Time 06/04/2012 00:54   Final    Culture     Final    Value:        BLOOD CULTURE RECEIVED NO GROWTH TO DATE CULTURE WILL BE HELD FOR 5 DAYS BEFORE ISSUING A FINAL NEGATIVE REPORT   Report Status PENDING   Incomplete     Studies/Results: No results found.    Assessment/Plan: Julie Johns is a 41 y.o. female with  Ovarian vein septic thrombophlebitis with pulmonary emboli  1) Septic thrombophlebitis with PE: Pharmacy having trouble with warfarin dosing and flagyl.  --I will change pt to once daily invanz for ease of dosing and this willl cover everything that rocephin and flagyl did (plus ESBLs) --agree with continued anticoagulation --There is no CLEAR cut evidence that her pathology involves active intravascular infection with bacteremia (though cultures taken on antibiotics) which would require longer term antibiotics parenterally --once pt completely deferfesces I would consider sending her home on an oral antibiotic regimen, (perhaps augmentin again)    LOS: 3 days   Acey Lav 06/06/2012, 10:46 AM

## 2012-06-06 NOTE — Progress Notes (Signed)
S: no BM since Wednesday. Notes pain in LLQ. Some brown vaginal bleeding/discharge. Had episode of night sweat correlating to temp 99.9 but lesssymptomatic than in the past  O: VSS Tmax 99.9 now afebrile  Lungs clear to A Cor RRR Abd: soft tender LLQ sl swollen on that side. Active BS  incisions well approximated. Less bruised RLQ  Pad no staining Extr: no edema or calf tenderness  INR: 1.79. Heparin level 0.44  hgb 9.9 hct 29.3 wbc 10. 3  plt 365K  Thrombophilic panel neg. BC neg so far  IMP: septic pelvic thrombophlebitis on IV ceftriaxone/flagyl PE.  S/PdaVinci Robotic TLH bilateral salpingectomy. POD # 16 P) disc issue of flagyl and coumadin interaction with Dr Daiva Eves Will switch to Invanz IV. Add miralax for bowel regimen. Cont IV antibiotics until febrile events resolved 24-28 hrs. Remain on heparin until coumadin therapeutic

## 2012-06-07 LAB — CBC
HCT: 29.4 % — ABNORMAL LOW (ref 36.0–46.0)
Hemoglobin: 9.8 g/dL — ABNORMAL LOW (ref 12.0–15.0)
MCH: 31.1 pg (ref 26.0–34.0)
MCHC: 33.3 g/dL (ref 30.0–36.0)
MCV: 93.3 fL (ref 78.0–100.0)
Platelets: 329 10*3/uL (ref 150–400)
RBC: 3.15 MIL/uL — ABNORMAL LOW (ref 3.87–5.11)
RDW: 12.1 % (ref 11.5–15.5)
WBC: 7.6 10*3/uL (ref 4.0–10.5)

## 2012-06-07 LAB — PROTIME-INR
INR: 2.37 — ABNORMAL HIGH (ref 0.00–1.49)
Prothrombin Time: 24.8 seconds — ABNORMAL HIGH (ref 11.6–15.2)

## 2012-06-07 LAB — HEPARIN LEVEL (UNFRACTIONATED): Heparin Unfractionated: 0.46 IU/mL (ref 0.30–0.70)

## 2012-06-07 MED ORDER — WARFARIN SODIUM 5 MG PO TABS
5.0000 mg | ORAL_TABLET | Freq: Once | ORAL | Status: AC
  Administered 2012-06-07: 5 mg via ORAL
  Filled 2012-06-07: qty 1

## 2012-06-07 MED ORDER — SENNOSIDES-DOCUSATE SODIUM 8.6-50 MG PO TABS
2.0000 | ORAL_TABLET | Freq: Two times a day (BID) | ORAL | Status: DC
  Administered 2012-06-07 (×2): 2 via ORAL

## 2012-06-07 NOTE — Progress Notes (Signed)
[facility]   Total days of antibiotics 12 Day 2 Ertapenem  Subjective: Still with  drenching sweats (less so thoguh) again last night w complaints, increased vaginal bleeding  Antibiotics:  Anti-infectives     Start     Dose/Rate Route Frequency Ordered Stop   06/06/12 1100   ertapenem (INVANZ) 1 g in sodium chloride 0.9 % 50 mL IVPB        1 g 100 mL/hr over 30 Minutes Intravenous Every 24 hours 06/06/12 1037     06/04/12 1400   metroNIDAZOLE (FLAGYL) tablet 500 mg  Status:  Discontinued        500 mg Oral 3 times per day 06/04/12 1303 06/06/12 1037   06/04/12 1300   cefTRIAXone (ROCEPHIN) 1 g in dextrose 5 % 50 mL IVPB  Status:  Discontinued        1 g 100 mL/hr over 30 Minutes Intravenous Every 24 hours 06/04/12 1303 06/06/12 1037   06/04/12 0900   ampicillin-sulbactam (UNASYN) 1.5 g in sodium chloride 0.9 % 50 mL IVPB  Status:  Discontinued        1.5 g 100 mL/hr over 30 Minutes Intravenous Every 6 hours 06/04/12 0853 06/04/12 1208          Medications: Scheduled Meds:    . ertapenem  1 g Intravenous Q24H  . iron polysaccharides  150 mg Oral BID  . polyethylene glycol  17 g Oral Daily  . senna-docusate  2 tablet Oral BID  . warfarin  5 mg Oral ONCE-1800  . warfarin  5 mg Oral ONCE-1800  . Warfarin - Pharmacist Dosing Inpatient   Does not apply q1800   Continuous Infusions:    . sodium chloride 50 mL/hr at 06/07/12 1116  . heparin 1,150 Units/hr (06/07/12 1510)  . lactated ringers 50 mL/hr at 06/03/12 1943   PRN Meds:.acetaminophen, ondansetron, traMADol   Objective: Weight change:   Intake/Output Summary (Last 24 hours) at 06/07/12 1644 Last data filed at 06/07/12 1400  Gross per 24 hour  Intake 2194.67 ml  Output   3050 ml  Net -855.33 ml   Blood pressure 106/68, pulse 83, temperature 98.9 F (37.2 C), temperature source Oral, resp. rate 20, height 5' 4.5" (1.638 m), weight 141 lb 9.6 oz (64.229 kg), SpO2  99.00%. Temp:  [98.1 F (36.7 C)-99.6 F (37.6 C)] 98.9 F (37.2 C) (09/29 1400) Pulse Rate:  [67-89] 83  (09/29 1400) Resp:  [18-20] 20  (09/29 1400) BP: (106-126)/(68-83) 106/68 mmHg (09/29 1400) SpO2:  [96 %-100 %] 99 % (09/29 1400)  Physical Exam: General: Alert and awake, oriented x3, not in any acute distress. CVS regular rate, normal r,  no murmur rubs or gallops Abdomen: soft  Tender diffusely greater in LLQ, no rebound Extremities: no  clubbing or edema noted bilaterally Neuro: nonfocal  Lab Results:  Basename 06/07/12 0555 06/06/12 0625  WBC 7.6 10.3  HGB 9.8* 9.9*  HCT 29.4* 29.3*  PLT 329 365    BMET No results found for this basename: NA:2,K:2,CL:2,CO2:2,GLUCOSE:2,BUN:2,CREATININE:2,CALCIUM:2 in the last 72 hours  Micro Results: Recent Results (from the past 240 hour(s))  CULTURE, BLOOD (ROUTINE X 2)     Status: Normal (Preliminary result)   Collection Time   06/03/12  8:00 PM      Component Value Range Status Comment   Specimen Description BLOOD RIGHT HAND   Final    Special Requests BOTTLES DRAWN AEROBIC AND ANAEROBIC 12 ML   Final  Culture  Setup Time 06/04/2012 00:54   Final    Culture     Final    Value:        BLOOD CULTURE RECEIVED NO GROWTH TO DATE CULTURE WILL BE HELD FOR 5 DAYS BEFORE ISSUING A FINAL NEGATIVE REPORT   Report Status PENDING   Incomplete   CULTURE, BLOOD (ROUTINE X 2)     Status: Normal (Preliminary result)   Collection Time   06/03/12  8:05 PM      Component Value Range Status Comment   Specimen Description BLOOD LEFT HAND   Final    Special Requests BOTTLES DRAWN AEROBIC AND ANAEROBIC   Final    Culture  Setup Time 06/04/2012 00:54   Final    Culture     Final    Value:        BLOOD CULTURE RECEIVED NO GROWTH TO DATE CULTURE WILL BE HELD FOR 5 DAYS BEFORE ISSUING A FINAL NEGATIVE REPORT   Report Status PENDING   Incomplete     Studies/Results: US Pelvis Complete  06/06/2012  *RADIOLOGY REPORT*  Clinical Data: Postop  robotic hysterectomy.  TRANSABDOMINAL ULTRASOUND OF PELVIS  Technique:  Transabdominal ultrasound examination of the pelvis was performed including evaluation of the uterus, ovaries, adnexal regions, and pelvic cul-de-sac.  Comparison:  CT 05/26/2012  Findings:  Uterus:  Surgically absent.  Endometrium: Surgically absent  Right ovary: 2.7 x 1.7 x 1.5 cm.  Left ovary: 3.3 x 2.5 x 2.7 cm.  Soft tissue mass superior to the left ovary measuring 2.5 x 3.1 x 3.5 cm  Other Findings:  No evidence of hematoma or abscess within the pelvis appear  IMPRESSION:  1.  No evidence of abscess or hematoma in the pelvis.  2.  Small soft tissue mass superior to the left ovary could represent a small postsurgical collection.   Original Report Authenticated By: Genevive Bi, M.D.       Assessment/Plan: Julie Johns is a 41 y.o. female with  Ovarian vein septic thrombophlebitis with pulmonary emboli  1) Septic thrombophlebitis with PE: US shows ovarian "mass" thought by Dr. Cherly Hensen to be the thrombus  Patient now on [facility]  --continue IV invanz --There is no CLEAR cut evidence that her pathology involves active intravascular infection with bacteremia (though cultures taken on antibiotics) which would require longer term antibiotics parenterally --once pt completely deferfesces I would consider sending her home on an oral antibiotic regimen, (perhaps augmentin again)  Dr. Orvan Falconer back in the am.   LOS: 4 days   Acey Lav 06/07/2012, 4:44 PM

## 2012-06-07 NOTE — Plan of Care (Signed)
Problem: Discharge Progression Outcomes Goal: Discharge plan in place and appropriate Outcome: Progressing Need INR between 2.0 and 3.0 Pain controlled Vaginal bleeding WNL Goal: Pain controlled with appropriate interventions Outcome: Completed/Met Date Met:  06/07/12 Ultram controls abd and headache pain

## 2012-06-07 NOTE — Progress Notes (Signed)
TC: C/o nausea ? Related to  Iron Had BM  X 1 soft  still notes intermittent blood with urine and still on pad  P) hold iron. Cont present mgmt

## 2012-06-07 NOTE — Progress Notes (Signed)
ANTICOAGULATION CONSULT NOTE - Follow Up Consult  Pharmacy Consult for  Heparin & Coumadin Indication: pulmonary embolus  Allergies  Allergen Reactions  . Morphine And Related Itching    Severe itching  . Percocet (Oxycodone-Acetaminophen) Nausea And Vomiting    Severe n/v  . Stadol (Butorphanol) Other (See Comments)    hallucinations  . Latex Rash  . Sulfa Antibiotics Rash    Patient Measurements: Height: 5' 4.5" (163.8 cm) Weight: 141 lb 9.6 oz (64.229 kg) IBW/kg (Calculated) : 55.85    Vital Signs: Temp: 98.1 F (36.7 C) (09/29 0627) Temp src: Oral (09/29 0627) BP: 115/75 mmHg (09/29 0627) Pulse Rate: 67  (09/29 0627)  Labs:  Basename 06/07/12 0555 06/06/12 0625 06/05/12 1422 06/05/12 0500  HGB 9.8* 9.9* -- --  HCT 29.4* 29.3* -- 28.7*  PLT 329 365 -- 330  APTT -- -- -- --  LABPROT 24.8* 20.2* -- 14.4  INR 2.37* 1.79* -- 1.14  HEPARINUNFRC 0.46 0.44 0.57 --  CREATININE -- -- -- --  CKTOTAL -- -- -- --  CKMB -- -- -- --  TROPONINI -- -- -- --    Estimated Creatinine Clearance: 80 ml/min (by C-G formula based on Cr of 0.67).   Medications:  Scheduled:    . ertapenem  1 g Intravenous Q24H  . iron polysaccharides  150 mg Oral BID  . polyethylene glycol  17 g Oral Daily  . senna-docusate  2 tablet Oral BID  . warfarin  5 mg Oral ONCE-1800  . Warfarin - Pharmacist Dosing Inpatient   Does not apply q1800  . DISCONTD: cefTRIAXone (ROCEPHIN)  IV  1 g Intravenous Q24H  . DISCONTD: metroNIDAZOLE  500 mg Oral Q8H   Infusions:    . sodium chloride 50 mL/hr at 06/06/12 2352  . heparin 1,150 Units/hr (06/06/12 1318)  . lactated ringers 50 mL/hr at 06/03/12 1943    Assessment: Heparin level is within goal range. INR is now 2.37 after coumadin 7.5 mg - 7.5 mg - 10 mg - 5 mg.  Metronidazole stopped 06/06/12.  Hgb / Hct are stable; platelet count is stable at 329.  Goal of Therapy:  INR 2-3 Heparin level 0.3-0.7 units/ml Monitor platelets by anticoagulation  protocol: Yes   Plan:  Continue heparin drip at 1150 units/hr.  If INR is therapeutic tomorrow, consider d/c'ing heparin. Coumadin 5 mg today. Daily heparin level, INR, & CBC.  Natasha Bence 06/07/2012,8:37 AM

## 2012-06-07 NOTE — Progress Notes (Signed)
S: no BM since Wednesday Had some vaginal bleeding on pa( seen by RN  Who reports size sl larger than 25 cents Had some night sweat. No pain  O: VSS Tmax 99.6 now 98.1 Lungs clear to A  Cor RRR Abd: soft active BS min tender LLQ Pad less than 25 cent size dark brick colored blood Ext' no edema or calf tenderness  Labs: wbc 7.6. hgb 9.8. plt   INR 2.37 heparin evel.0.46 Sono: 3.5 cm left soft tissue mass  above ovary  No collection or hematoma  IMP: Septic pelvic thrombophlebitis on Invanz Pulmonary Embolism now starting for therapeutic coumadin S/P da Vinci total hysterectomy Vaginal bleeding thought 2nd to anticoag P) Bowel regimen. Cont anticoag mgmt per pharm( spoke w/ pharm   Need more time on heparin to declare stability of INR. Watch vag bleed. If increases need repeat spec exam. Cont antibiotics

## 2012-06-08 LAB — ABO/RH: ABO/RH(D): A POS

## 2012-06-08 LAB — PROTIME-INR
INR: 2.58 — ABNORMAL HIGH (ref 0.00–1.49)
Prothrombin Time: 26.4 seconds — ABNORMAL HIGH (ref 11.6–15.2)

## 2012-06-08 LAB — CBC
HCT: 29.2 % — ABNORMAL LOW (ref 36.0–46.0)
Hemoglobin: 9.8 g/dL — ABNORMAL LOW (ref 12.0–15.0)
MCH: 31.4 pg (ref 26.0–34.0)
MCHC: 33.6 g/dL (ref 30.0–36.0)
MCV: 93.6 fL (ref 78.0–100.0)
Platelets: 346 10*3/uL (ref 150–400)
RBC: 3.12 MIL/uL — ABNORMAL LOW (ref 3.87–5.11)
RDW: 12.2 % (ref 11.5–15.5)
WBC: 7.9 10*3/uL (ref 4.0–10.5)

## 2012-06-08 LAB — TYPE AND SCREEN
ABO/RH(D): A POS
Antibody Screen: NEGATIVE

## 2012-06-08 LAB — HEPARIN LEVEL (UNFRACTIONATED): Heparin Unfractionated: <0.1 IU/mL — ABNORMAL LOW (ref 0.30–0.70)

## 2012-06-08 MED ORDER — AMOXICILLIN-POT CLAVULANATE 875-125 MG PO TABS: 1.0000 | ORAL_TABLET | Freq: Two times a day (BID) | ORAL | Status: DC

## 2012-06-08 MED ORDER — WARFARIN SODIUM 5 MG PO TABS
5.0000 mg | ORAL_TABLET | Freq: Every day | ORAL | Status: DC
  Filled 2012-06-08: qty 1

## 2012-06-08 MED ORDER — WARFARIN SODIUM 5 MG PO TABS: 5.0000 mg | ORAL_TABLET | Freq: Every day | ORAL | Status: DC

## 2012-06-08 NOTE — Progress Notes (Signed)
Pt  Ambulated out   Teaching complete    

## 2012-06-08 NOTE — Progress Notes (Signed)
Patient ID: Julie Johns, female   DOB: December 19, 1970, 41 y.o.   MRN: [id number]    [facility]    Date of Admission:  06/03/2012   Total days of antibiotics 13         Principal Problem:  *Thrombophlebitis - postoperative Active Problems:  PE (pulmonary thromboembolism)  Uterine fibroid      . ertapenem  1 g Intravenous Q24H  . iron polysaccharides  150 mg Oral BID  . polyethylene glycol  17 g Oral Daily  . senna-docusate  2 tablet Oral BID  . warfarin  5 mg Oral ONCE-1800  . warfarin  5 mg Oral q1800  . 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 She's not had any cough or shortness of breath or chest pain. Her left lower quadrant pain has diminished. She still having some night sweats but no rigor.  Objective: Temp:  [98.3 F (36.8 C)-99.2 F (37.3 C)] 98.5 F (36.9 C) (09/30 1000) Pulse Rate:  [77-83] 79  (09/30 1000) Resp:  [18-20] 18  (09/30 1000) BP: (106-128)/(68-83) 112/70 mmHg (09/30 1000) SpO2:  [98 %-99 %] 98 % (09/30 1000)  General: She is in good spirits in no distress Skin: No rash Lungs: Clear Cor: Regular S1-S2 no murmurs Abdomen: Soft with minimal lower quadrant tenderness  Lab Results Lab Results  Component Value Date   WBC 7.9 06/08/2012   HGB 9.8* 06/08/2012   HCT 29.2* 06/08/2012   MCV 93.6 06/08/2012   PLT 346 06/08/2012    Lab Results  Component Value Date   CREATININE 0.67 05/26/2012   BUN 11 05/26/2012   NA 137 05/26/2012   K 3.9 05/26/2012   CL 99 05/26/2012   CO2 30 05/26/2012    No results found for this basename: ALT, AST, GGT, ALKPHOS, BILITOT      Microbiology: Recent Results (from the past 240 hour(s))  CULTURE, BLOOD (ROUTINE X 2)     Status: Normal (Preliminary result)   Collection Time   06/03/12  8:00 PM      Component Value Range Status Comment   Specimen Description BLOOD RIGHT HAND   Final    Special Requests BOTTLES DRAWN  AEROBIC AND ANAEROBIC 12 ML   Final    Culture  Setup Time 06/04/2012 00:54   Final    Culture     Final    Value:        BLOOD CULTURE RECEIVED NO GROWTH TO DATE CULTURE WILL BE HELD FOR 5 DAYS BEFORE ISSUING A FINAL NEGATIVE REPORT   Report Status PENDING   Incomplete   CULTURE, BLOOD (ROUTINE X 2)     Status: Normal (Preliminary result)   Collection Time   06/03/12  8:05 PM      Component Value Range Status Comment   Specimen Description BLOOD LEFT HAND   Final    Special Requests BOTTLES DRAWN AEROBIC AND ANAEROBIC   Final    Culture  Setup Time 06/04/2012 00:54   Final    Culture     Final    Value:        BLOOD CULTURE RECEIVED NO GROWTH TO DATE CULTURE WILL BE HELD FOR 5 DAYS BEFORE ISSUING A FINAL NEGATIVE REPORT   Report Status PENDING   Incomplete     Studies/Results: US Pelvis Complete  06/06/2012  *RADIOLOGY REPORT*  Clinical Data: Postop robotic hysterectomy.  TRANSABDOMINAL  ULTRASOUND OF PELVIS  Technique:  Transabdominal ultrasound examination of the pelvis was performed including evaluation of the uterus, ovaries, adnexal regions, and pelvic cul-de-sac.  Comparison:  CT 05/26/2012  Findings:  Uterus:  Surgically absent.  Endometrium: Surgically absent  Right ovary: 2.7 x 1.7 x 1.5 cm.  Left ovary: 3.3 x 2.5 x 2.7 cm.  Soft tissue mass superior to the left ovary measuring 2.5 x 3.1 x 3.5 cm  Other Findings:  No evidence of hematoma or abscess within the pelvis appear  IMPRESSION:  1.  No evidence of abscess or hematoma in the pelvis.  2.  Small soft tissue mass superior to the left ovary could represent a small postsurgical collection.   Original Report Authenticated By: Genevive Bi, M.D.     Assessment: She is improving on anticoagulation and empiric antibiotics for septic pelvic thrombophlebitis dictated by pulmonary emboli. There no prospective trials on duration of therapy in this situation. I would change her back to oral Augmentin and continue therapy for another  4-5 days.  Plan: 1. Agree with discharge on oral Augmentin for 4-5 more days 2. Please call if I can be of further assistance  Cliffton Asters, MD [facility] [phone] pager   [phone] cell 06/08/2012, 1:51 PM

## 2012-06-08 NOTE — Progress Notes (Signed)
Patient called out stated her pad was soaked.Checked Peri pad and it was saturated from front to back bright bleeding.Dr Cherly Hensen notified and stat orders given for CBC,INR,Type and Screen,and Heparin level.

## 2012-06-08 NOTE — Progress Notes (Signed)
Ur chart review completed.  

## 2012-06-08 NOTE — Progress Notes (Signed)
S: slept well after vaginal bleeding incidence. Pt notes left sided back/flank pain resolved. Noted scant stain on pad  And urine was clear. Pt interested in going home  O: VSS afebrile Lungs clear to A Cor RRR  Abd: soft non distended min tender  Pad streak of pink only Extr: no edema or calf tenderness  Labs:   A/P Pulmonary embolism now fully anticoagulated now off IV heparin Coumadin 5 mg po qd Septic pelvic thrombophlebitis on Invanz Vaginal bleeding appears to be minimal S/p daVinci robotic hysterectomy  P)  Await ID recommendation. Cont coumadin. Will check regarding [facility] for mgmt. If remain stable, d/c home this pm ? Cont w/ augmentin  And for how long

## 2012-06-08 NOTE — Progress Notes (Signed)
ANTICOAGULATION CONSULT NOTE - Follow Up Consult  Pharmacy Consult for Heparin and Coumadin Indication: pulmonary embolus  Patient Measurements: Height: 5' 4.5" (163.8 cm) Weight: 141 lb 9.6 oz (64.229 kg) IBW/kg (Calculated) : 55.85  Heparin Dosing Weight: 55.85  Vital Signs: Temp: 98.3 F (36.8 C) (09/30 0500) Temp src: Oral (09/30 0500) BP: 122/77 mmHg (09/30 0500) Pulse Rate: 80  (09/30 0500)  Labs:  Basename 06/08/12 0155 06/07/12 0555 06/06/12 0625  HGB 9.8* 9.8* --  HCT 29.2* 29.4* 29.3*  PLT 346 329 365  APTT -- -- --  LABPROT 26.4* 24.8* 20.2*  INR 2.58* 2.37* 1.79*  HEPARINUNFRC <0.10* 0.46 0.44  CREATININE -- -- --  CKTOTAL -- -- --  CKMB -- -- --  TROPONINI -- -- --   Estimated Creatinine Clearance: 80 ml/min (by C-G formula based on Cr of 0.67).  Medications:  Heparin infusion 1150 unit/hr --> stoppped around 0200 this morning Invanz 1g IV q24hr  Assessment: Heparin infusion discontinued due to increased vaginal bleeding. INR = 2.58 and above goal 2-3 for 24hrs. Hgb and platelet are stable and vaginal bleeding appears to be minimal now.  Goal of Therapy:  INR 2-3 Monitor platelets by anticoagulation protocol: Yes   Plan:  Recommend Coumadin 5mg  po daily for discharge. Patient will need f/u appointment regarding outpatient Coumadin monitoring ( Coumadin clinic). Coumadin eduction done. Anticipate D/C home today.    Michelene Heady Braxton 06/08/2012,9:43 AM

## 2012-06-08 NOTE — Discharge Summary (Signed)
Physician Discharge Summary  Patient ID: Julie Johns MRN: [id number] DOB/AGE: March 28, 1971 41 y.o.  Admit date: 06/03/2012 Discharge date: 06/08/2012  Admission Diagnoses:RLL pulmonary edema, left gonadal vein thrombosis ? Septic pelvic thrombophlebitis. S/p Davinci robotic total hysterectomy, bilateral salpingetomy  Discharge Diagnoses:  Pulmonary thromboembolism Septic pelvic thrombophlebitis Left gonadal vein thrombosis S/p Davinci robotic total hysterectomy  Discharged Condition: stable  Hospital Course: pt was admitted to [facility] after CTscan dx RLL PE and left gonadal vein thrombosis. Pt underwent dopplers of lower extremities which were neg. ID consult for poss septic pelvic thrombophlebitis. Recommendation at that time was blood cultures x 2 and watch fever curve. Formal ID consult started ceftriaxone and flagyl. Thrombophilic panel was drawn and was neg. IV heparin and concomitant coumadin was started and adjustment made by pharmacy. Serial INR and heparin levels, cbc done. Pt would continue to have night sweats but less severe. Pt also would have blood noted only after void but was actually coming from vagina  But not showing on pad. Flagyl was discontinued to due poss effect on coumadin as well as nausea. IV antibiotic regimen was changed to Invanz.  Pt subsequently had increased vaginal bleeding for which speculum exam was done and clots removed. Pelvic sonogram was done to r/o cuff hematoma. A  Left soft tissue mass was noted but no collection at the cuff. The INR was therapeutic and therefore IV heparin was discontinued in light of the bleeding.  Blood cultures were neg. CBC was stable despite the bleeding and the platelet count remained normal. Feeling better, pt was discharged on augmentin for 5 days and to continue on coumadin  Consults: ID  Significant Diagnostic Studies: labs: thrombophilic panel, dopplers of lower ext, pelvic sonogram  Treatments: anticoagulation: heparin and  warfarin, antibiotics  Discharge Exam: Blood pressure 112/70, pulse 79, temperature 98.5 F (36.9 C), temperature source Oral, resp. rate 18, height 5' 4.5" (1.638 m), weight 64.229 kg (141 lb 9.6 oz), SpO2 98.00%. General appearance: alert, cooperative and no distress Back: negative, symmetric, no curvature. ROM normal. No CVA tenderness. Cardio: regular rate and rhythm GI: soft nondistended (+) BS well approximated incisions min tender LLQ Pelvic: deferred Extremities: Homans sign is negative, no sign of DVT and no edema, redness or tenderness in the calves or thighs  Disposition: 01-Home or Self Care  Discharge Orders    Future Appointments: Provider: Department: Dept Phone: Center:   06/12/2012 4:30 PM Lewayne Bunting, MD [facility] [phone] [facility]     Future Orders Please Complete By Expires   Discharge patient          Medication List     As of 06/08/2012  1:49 PM    TAKE these medications         amoxicillin-clavulanate 875-125 MG per tablet   Commonly known as: AUGMENTIN   Take 1 tablet by mouth every 12 (twelve) hours.   Start taking on: 06/09/2012      bisacodyl 10 MG suppository   Commonly known as: DULCOLAX   Place 10 mg rectally as needed.      HYDROmorphone 2 MG tablet   Commonly known as: DILAUDID   One to two tablet every 4-6 hours prn pain      ibuprofen 800 MG tablet   Commonly known as: ADVIL,MOTRIN   Take 800 mg by mouth every 8 (eight) hours as needed. Pain all over      magnesium citrate Soln   Take 1 Bottle by mouth once.  warfarin 5 MG tablet   Commonly known as: COUMADIN   Take 1 tablet (5 mg total) by mouth daily at 6 PM.           Follow-up Information    In 8 days to follow up.         Signed: Thursa Emme A 06/08/2012, 1:49 PM

## 2012-06-08 NOTE — Progress Notes (Signed)
S: called by RN regarding soaking a pad w/ bright red blood Pt notes left side pain decreased and back pain resolved after bleeding O: Pt in NAD Abd; non distended , soft, nontender  LLQ VE: dark red blood noted on towel. Original bleeding noted in bathroom Speculum done: dark  clotted blood filled upper vagina. Clots removed. Cuff viewed by me and RN: no active bleeding noted and cuff well approximated  Labs; stat CBC, INR, T&S, heparin level ordered  A/P: Pulmonary embolism on IV heparin/oral coumadin( 5mg ). Septic pelvic thrombophlebitis on IV antibiotics P) await labs. Suspect related to heparin. If still therapeutic level for coumadin, will d/c heparin. Advised pt, if bleeding persists, need OR eval

## 2012-06-10 LAB — CULTURE, BLOOD (ROUTINE X 2)
Culture: NO GROWTH
Culture: NO GROWTH

## 2012-06-11 ENCOUNTER — Encounter: Payer: Self-pay | Admitting: *Deleted

## 2012-06-12 ENCOUNTER — Ambulatory Visit (INDEPENDENT_AMBULATORY_CARE_PROVIDER_SITE_OTHER): Payer: Commercial Managed Care - PPO | Admitting: Cardiology

## 2012-06-12 ENCOUNTER — Ambulatory Visit (INDEPENDENT_AMBULATORY_CARE_PROVIDER_SITE_OTHER): Payer: Commercial Managed Care - PPO | Admitting: Pharmacist

## 2012-06-12 ENCOUNTER — Encounter: Payer: Self-pay | Admitting: Cardiology

## 2012-06-12 VITALS — BP 122/75 | HR 91 | Ht 64.5 in | Wt 134.0 lb

## 2012-06-12 DIAGNOSIS — I2699 Other pulmonary embolism without acute cor pulmonale: Secondary | ICD-10-CM

## 2012-06-12 DIAGNOSIS — I2782 Chronic pulmonary embolism: Secondary | ICD-10-CM

## 2012-06-12 DIAGNOSIS — Z7901 Long term (current) use of anticoagulants: Secondary | ICD-10-CM

## 2012-06-12 LAB — POCT INR: INR: 3.4

## 2012-06-12 NOTE — Progress Notes (Signed)
  HPI: pleasant 41 year old female for evaluation of pulmonary embolus. She had robotic laparoscopic total hysterectomy and bilateral salpingectomy on September 12. She subsequently developed low-grade fever, night sweats and general maladies. She was ultimately diagnosed with septic pelvic thrombophlebitis associated with a pulmonary embolus. Note lower extremity venous Dopplers on September 25 showed no evidence of DVT. Chest CT showed RLL pulmonary embolus. Abdominal CT showed left gonadal vein thrombosis. There is a small amount of postsurgical fluid anterior to the vaginal cuff including a possible 1.2 x 2.3 early abscess. Factor V Leiden negative. Lupus anticoagulant negative. Anti-thrombin 3 normal. Protein C and S normal. Patient discharged 5 days ago. Since that time she denies dyspnea, chest pain, palpitations or syncope. Her night sweats have much improved.  Current Outpatient Prescriptions  Medication Sig Dispense Refill  . amoxicillin-clavulanate (AUGMENTIN) 875-125 MG per tablet Take 1 tablet by mouth every 12 (twelve) hours.  10 tablet  0  . bisacodyl (DULCOLAX) 10 MG suppository Place 10 mg rectally as needed.      . warfarin (COUMADIN) 5 MG tablet Take 1 tablet (5 mg total) by mouth daily at 6 PM.  30 tablet  6    Allergies  Allergen Reactions  . Morphine And Related Itching    Severe itching  . Percocet (Oxycodone-Acetaminophen) Nausea And Vomiting    Severe n/v  . Stadol (Butorphanol) Other (See Comments)    hallucinations  . Latex Rash  . Sulfa Antibiotics Rash    Past Medical History  Diagnosis Date  . Uterine fibroid   . Thrombophlebitis - postoperative   . PE (pulmonary thromboembolism)     Past Surgical History  Procedure Date  . Lasik   . Myomectomy 2000  . Tubal ligation 2005  . Mouth surgery   . Cesarean section 2001, 2005  . Tubal ligation   . Novasure ablation   . Abdominal hysterectomy     History   Social History  . Marital Status: Married   Spouse Name: N/A    Number of Children: 2  . Years of Education: N/A   Occupational History  .      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 Smoking status: Never Smoker   . Smokeless tobacco: Never Used  . Alcohol Use: Yes     occasional  . Drug Use: No  . Sexually Active: Yes    Birth Control/ Protection: Surgical   Other Topics Concern  . Not on file   Social History Narrative  . No narrative on file    Family History  Problem Relation Age of Onset  . Other Neg Hx     ROS: no fevers or chills, productive cough, hemoptysis, dysphasia, odynophagia, melena, hematochezia, dysuria, hematuria, rash, seizure activity, orthopnea, PND, pedal edema, claudication. Remaining systems are negative.  Physical Exam:   Blood pressure 122/75, pulse 91, height 5' 4.5" (1.638 m), weight 134 lb (60.782 kg).  General:  Well developed/well nourished in NAD Skin warm/dry Patient not depressed No peripheral clubbing Back-normal HEENT-normal/normal eyelids Neck supple/normal carotid upstroke bilaterally; no bruits; no JVD; no thyromegaly chest - CTA/ normal expansion CV - RRR/normal S1 and S2; no murmurs, rubs or gallops;  PMI nondisplaced Abdomen -NT/ND, no HSM, no mass, + bowel sounds, no bruit, status post recent abdominal surgery. 2+ femoral pulses, no bruits Ext-no edema, chords, 2+ DP Neuro-grossly nonfocal  ECG sinus rhythm at a rate of 81. Axis normal. RV conduction delay.

## 2012-06-12 NOTE — Patient Instructions (Addendum)

## 2012-06-12 NOTE — Assessment & Plan Note (Signed)
Plan continue Coumadin with goal INR 2-3. We discussed xeralto and patient will consider changing. Check echocardiogram for RV function. She will most likely require anticoagulation for 6-9 months and then we will discontinue.

## 2012-06-18 ENCOUNTER — Ambulatory Visit (HOSPITAL_COMMUNITY): Payer: Commercial Managed Care - PPO | Attending: Cardiology

## 2012-06-18 ENCOUNTER — Ambulatory Visit (INDEPENDENT_AMBULATORY_CARE_PROVIDER_SITE_OTHER): Payer: Commercial Managed Care - PPO | Admitting: *Deleted

## 2012-06-18 DIAGNOSIS — I2699 Other pulmonary embolism without acute cor pulmonale: Secondary | ICD-10-CM | POA: Insufficient documentation

## 2012-06-18 DIAGNOSIS — I369 Nonrheumatic tricuspid valve disorder, unspecified: Secondary | ICD-10-CM | POA: Insufficient documentation

## 2012-06-18 DIAGNOSIS — I2782 Chronic pulmonary embolism: Secondary | ICD-10-CM

## 2012-06-18 DIAGNOSIS — Z7901 Long term (current) use of anticoagulants: Secondary | ICD-10-CM

## 2012-06-18 DIAGNOSIS — I059 Rheumatic mitral valve disease, unspecified: Secondary | ICD-10-CM | POA: Insufficient documentation

## 2012-06-18 LAB — POCT INR: INR: 2.2

## 2012-06-18 NOTE — Progress Notes (Signed)
Echocardiogram performed.  

## 2012-06-25 ENCOUNTER — Ambulatory Visit (INDEPENDENT_AMBULATORY_CARE_PROVIDER_SITE_OTHER): Payer: Commercial Managed Care - PPO | Admitting: Pharmacist

## 2012-06-25 DIAGNOSIS — I2782 Chronic pulmonary embolism: Secondary | ICD-10-CM

## 2012-06-25 DIAGNOSIS — Z7901 Long term (current) use of anticoagulants: Secondary | ICD-10-CM

## 2012-06-25 DIAGNOSIS — I2699 Other pulmonary embolism without acute cor pulmonale: Secondary | ICD-10-CM

## 2012-06-25 LAB — POCT INR: INR: 2.1

## 2012-06-26 ENCOUNTER — Other Ambulatory Visit: Payer: Self-pay | Admitting: Pharmacist

## 2012-06-26 MED ORDER — WARFARIN SODIUM 5 MG PO TABS: ORAL_TABLET | ORAL | Status: DC

## 2012-07-02 ENCOUNTER — Ambulatory Visit (INDEPENDENT_AMBULATORY_CARE_PROVIDER_SITE_OTHER): Payer: Commercial Managed Care - PPO | Admitting: Pharmacist

## 2012-07-02 DIAGNOSIS — I2782 Chronic pulmonary embolism: Secondary | ICD-10-CM

## 2012-07-02 DIAGNOSIS — Z7901 Long term (current) use of anticoagulants: Secondary | ICD-10-CM

## 2012-07-02 DIAGNOSIS — I2699 Other pulmonary embolism without acute cor pulmonale: Secondary | ICD-10-CM

## 2012-07-02 LAB — POCT INR: INR: 2.2

## 2012-07-16 ENCOUNTER — Ambulatory Visit (INDEPENDENT_AMBULATORY_CARE_PROVIDER_SITE_OTHER): Payer: Commercial Managed Care - PPO | Admitting: *Deleted

## 2012-07-16 DIAGNOSIS — I2699 Other pulmonary embolism without acute cor pulmonale: Secondary | ICD-10-CM

## 2012-07-16 DIAGNOSIS — I2782 Chronic pulmonary embolism: Secondary | ICD-10-CM

## 2012-07-16 DIAGNOSIS — Z7901 Long term (current) use of anticoagulants: Secondary | ICD-10-CM

## 2012-07-16 LAB — POCT INR: INR: 1.5

## 2012-07-30 ENCOUNTER — Ambulatory Visit (INDEPENDENT_AMBULATORY_CARE_PROVIDER_SITE_OTHER): Payer: Commercial Managed Care - PPO | Admitting: *Deleted

## 2012-07-30 DIAGNOSIS — I2782 Chronic pulmonary embolism: Secondary | ICD-10-CM

## 2012-07-30 DIAGNOSIS — Z7901 Long term (current) use of anticoagulants: Secondary | ICD-10-CM

## 2012-07-30 DIAGNOSIS — I2699 Other pulmonary embolism without acute cor pulmonale: Secondary | ICD-10-CM

## 2012-07-30 LAB — POCT INR: INR: 2.2

## 2012-08-20 ENCOUNTER — Ambulatory Visit (INDEPENDENT_AMBULATORY_CARE_PROVIDER_SITE_OTHER): Payer: Commercial Managed Care - PPO | Admitting: *Deleted

## 2012-08-20 DIAGNOSIS — I2782 Chronic pulmonary embolism: Secondary | ICD-10-CM

## 2012-08-20 DIAGNOSIS — I2699 Other pulmonary embolism without acute cor pulmonale: Secondary | ICD-10-CM

## 2012-08-20 DIAGNOSIS — Z7901 Long term (current) use of anticoagulants: Secondary | ICD-10-CM

## 2012-08-20 LAB — POCT INR: INR: 1.7

## 2013-02-25 ENCOUNTER — Ambulatory Visit: Payer: Self-pay | Admitting: Cardiology

## 2013-02-25 DIAGNOSIS — Z7901 Long term (current) use of anticoagulants: Secondary | ICD-10-CM

## 2013-02-25 DIAGNOSIS — I2699 Other pulmonary embolism without acute cor pulmonale: Secondary | ICD-10-CM

## 2013-08-01 IMAGING — CT CT ABD-PELV W/ CM
1 of 3 series · 13 of 32 positions shown, 18 images · IV contrast (OMNIPAQUE)
Comparison: pelvis MRI 04/21/2012.

CLINICAL DATA: 41-year-old female with abdominal, right-sided pain,
pelvic pain.  5 days status post hysterectomy.

CT ABDOMEN AND PELVIS WITH CONTRAST
TECHNIQUE: Multidetector CT imaging of the abdomen and pelvis was
performed following the standard protocol during bolus
administration of intravenous contrast.
Contrast: 100mL OMNIPAQUE IOHEXOL 300 MG/ML  SOLN

[Series 2: routine abdomen/pelvis with · axial · 0.75mm/px · z∈[-409,-54]mm · 13 of 81 slices shown, 18 images]
[im 5/81  soft-tissue]
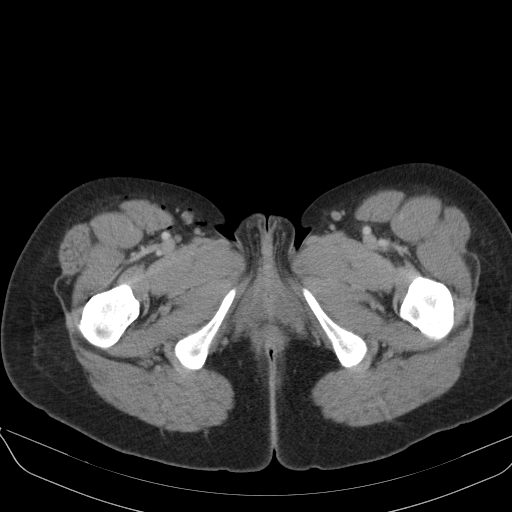
[im 5/81  bone]
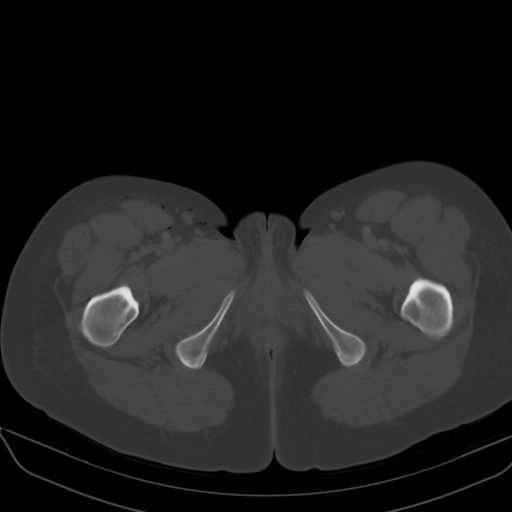
[im 13/81  soft-tissue]
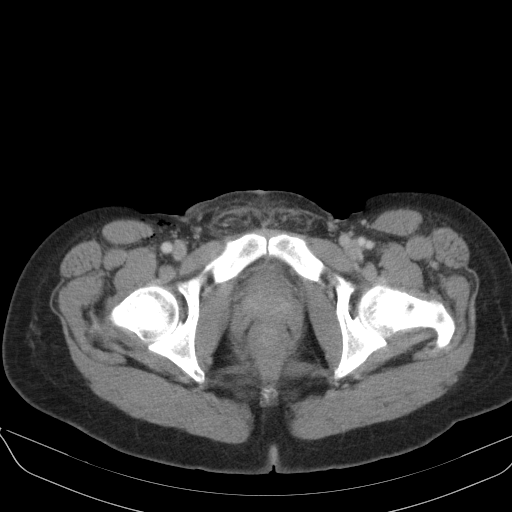
[im 17/81  soft-tissue]
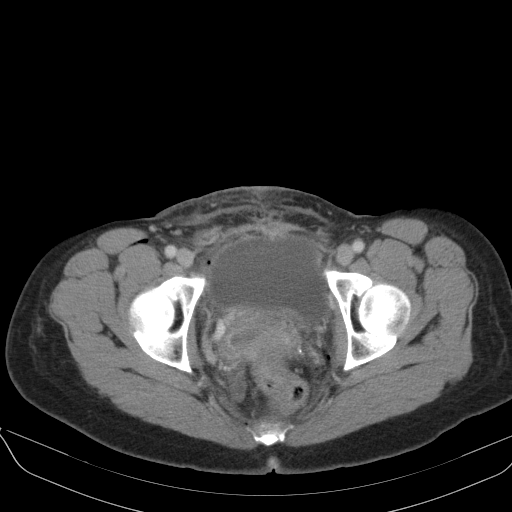
[im 26/81  soft-tissue]
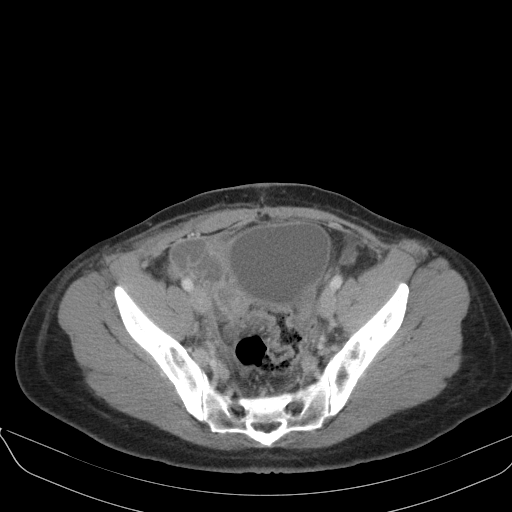
[im 30/81  soft-tissue]
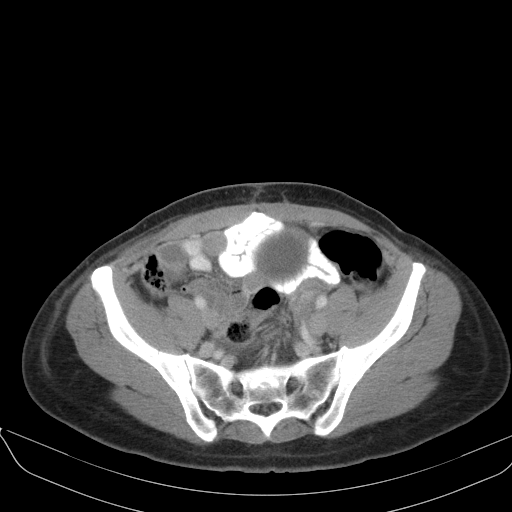
[im 38/81  soft-tissue]
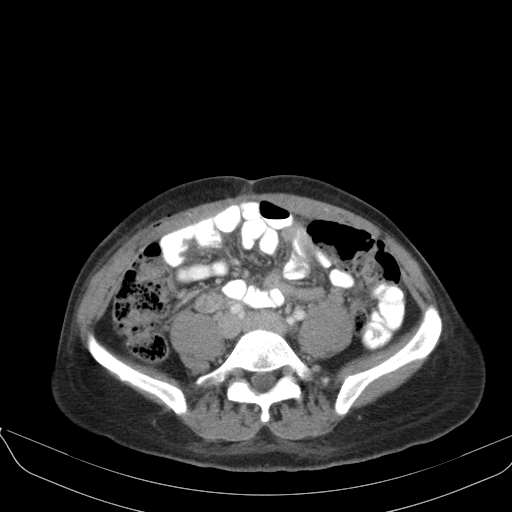
[im 43/81  soft-tissue]
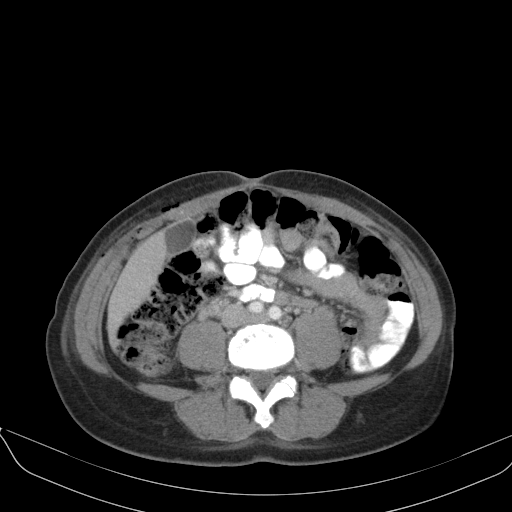
[im 51/81  soft-tissue]
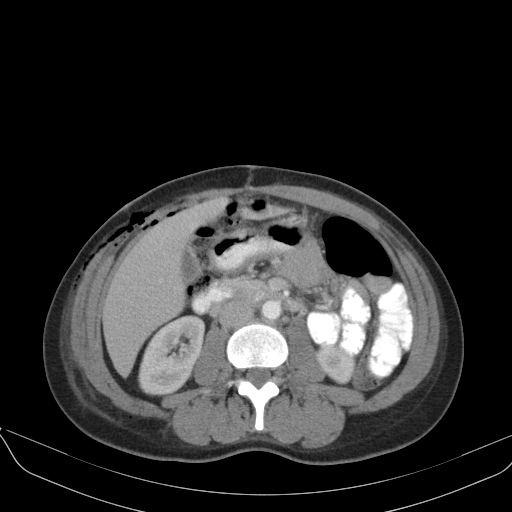
[im 55/81  soft-tissue]
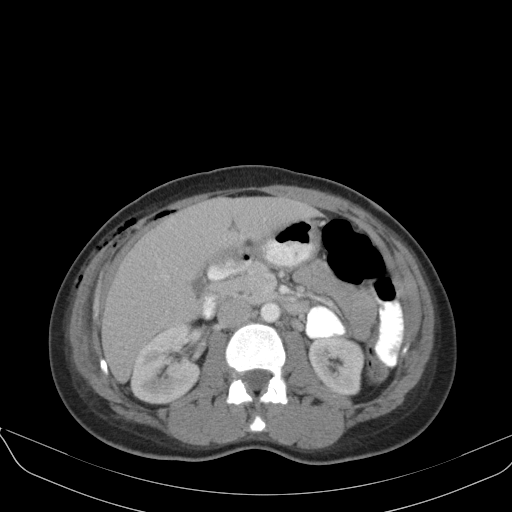
[im 55/81  bone]
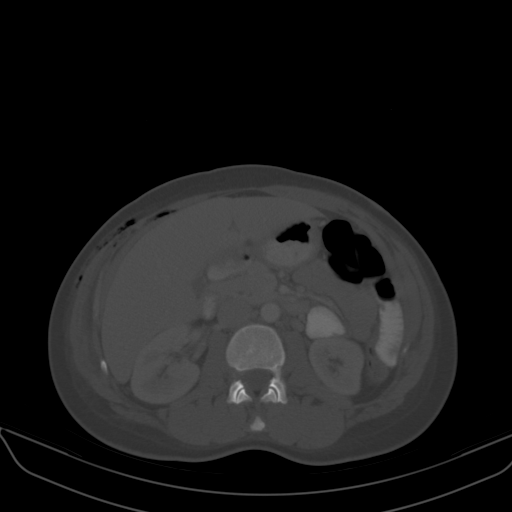
[im 64/81  soft-tissue]
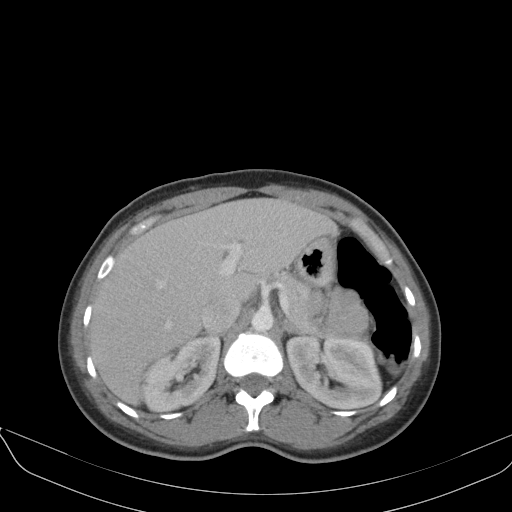
[im 64/81  lung]
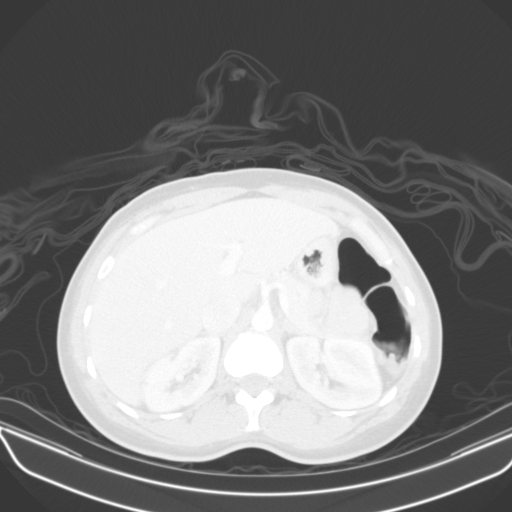
[im 68/81  soft-tissue]
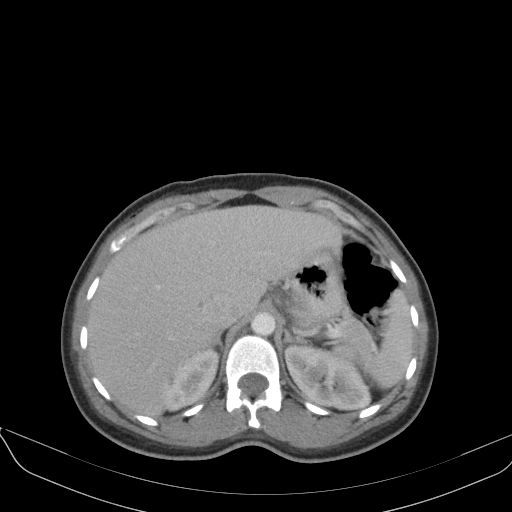
[im 68/81  lung]
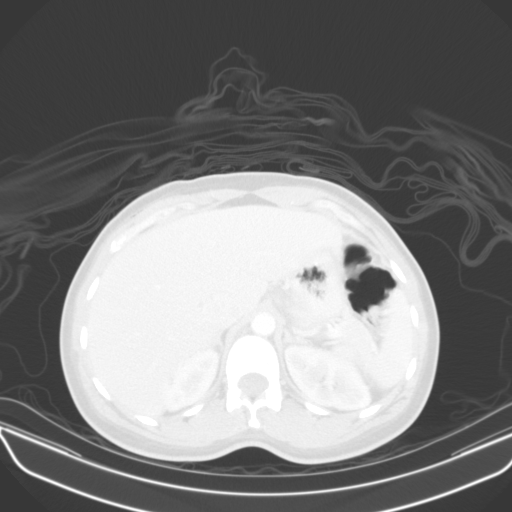
[im 72/81  lung]
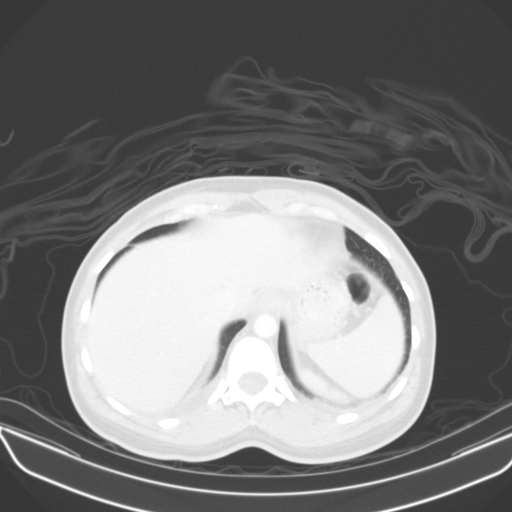
[im 76/81  soft-tissue]
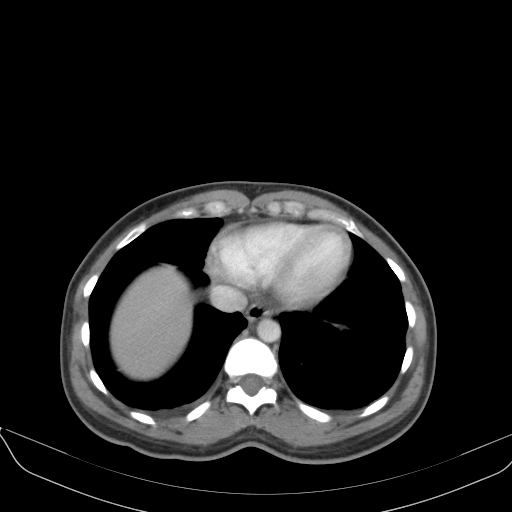
[im 76/81  lung]
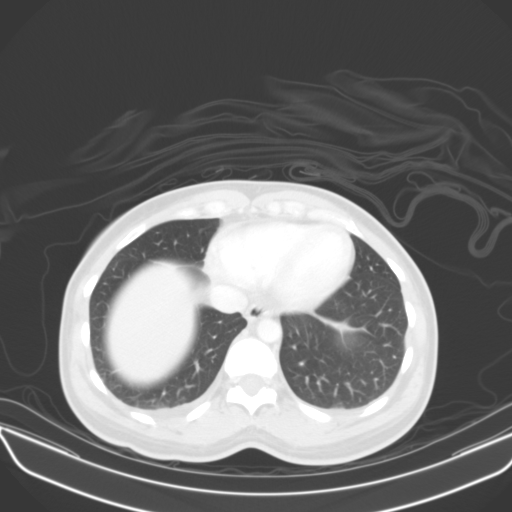

[13 of 32 positions shown; findings below may reference images not displayed]

FINDINGS: Trace layering pleural effusions.  No pericardial
effusion.  Lung bases otherwise clear.  Chronic L4-L5 disc
degeneration. No acute osseous abnormality identified.

Stranding from the in the ventral lower abdomen and pelvis likely
is postoperative.  Subcutaneous gas tracking in the right oblique
abdominal muscles and the along the right inguinal ligament to the
quadriceps the muscles.  Some tracking fluid in the subcutaneous
fat greater on the right, but no organized subcutaneous fluid
collection.

Mildly dilated bladder.  Trace focus of gas in the bladder may
reflect recent catheterization.

Mild central pelvic stranding and trace free fluid.  At the vaginal
cuff there is some hyperenhancement and indistinct fluid noted, but
this fluid appears to be within the vaginal fornix.

Distal colon appears only mildly affected, nondilated and otherwise
within normal limits.  Major pelvic arterial structures are patent.

The two to mild gaseous distention and retained stool in the more
proximal left colon.  Retained stool the transverse and right
colon.  Incidental normal gas containing nondilated appendix and
right lower quadrant (series 2 image 52).  Oral contrast has not
yet reached the distal small bowel.  No dilated small bowel.

Decompressed stomach and duodenum.  The liver, gallbladder, spleen,
pancreas, adrenal glands, and kidneys are within normal limits.
Portal venous system appears grossly normal.  Major abdominal
arterial structures are within normal limits. Delayed renal images
demonstrate normal intrarenal collecting systems and proximal
ureters.
IMPRESSION: 1.  Expected postoperative appearance of the central pelvis 5 days
post op from hysterectomy.  Trace fluid and mild to moderate
stranding but no pelvic organized fluid collection or abscess.
2.  Postoperative changes to the ventral abdominal wall with
tracking subcutaneous gas and trace fluid, but no organized fluid
collection.
3.  Mildly distended bladder, trace gas within the bladder probably
reflects recent catheterization. Clinical correlation recommended.
4.  Trace pleural effusions.

## 2013-10-12 ENCOUNTER — Other Ambulatory Visit: Payer: Self-pay | Admitting: Obstetrics and Gynecology

## 2013-10-12 DIAGNOSIS — Z1231 Encounter for screening mammogram for malignant neoplasm of breast: Secondary | ICD-10-CM

## 2013-10-22 ENCOUNTER — Ambulatory Visit: Payer: Commercial Managed Care - PPO

## 2013-10-22 ENCOUNTER — Ambulatory Visit
Admission: RE | Admit: 2013-10-22 | Discharge: 2013-10-22 | Disposition: A | Discharge status: Home / Self Care | Payer: Commercial Managed Care - PPO | Source: Ambulatory Visit | Attending: Obstetrics and Gynecology | Admitting: Obstetrics and Gynecology

## 2013-10-22 ENCOUNTER — Ambulatory Visit: Payer: PRIVATE HEALTH INSURANCE

## 2013-10-22 DIAGNOSIS — Z1231 Encounter for screening mammogram for malignant neoplasm of breast: Secondary | ICD-10-CM

## 2013-10-27 ENCOUNTER — Other Ambulatory Visit: Payer: Self-pay | Admitting: Obstetrics and Gynecology

## 2013-10-27 DIAGNOSIS — R928 Other abnormal and inconclusive findings on diagnostic imaging of breast: Secondary | ICD-10-CM

## 2013-11-02 ENCOUNTER — Ambulatory Visit
Admission: RE | Admit: 2013-11-02 | Discharge: 2013-11-02 | Disposition: A | Discharge status: Home / Self Care | Payer: PRIVATE HEALTH INSURANCE | Source: Ambulatory Visit | Attending: Obstetrics and Gynecology | Admitting: Obstetrics and Gynecology

## 2013-11-02 DIAGNOSIS — R928 Other abnormal and inconclusive findings on diagnostic imaging of breast: Secondary | ICD-10-CM

## 2013-11-08 ENCOUNTER — Other Ambulatory Visit: Payer: Commercial Managed Care - PPO

## 2014-07-28 ENCOUNTER — Ambulatory Visit (HOSPITAL_COMMUNITY)
Admission: RE | Admit: 2014-07-28 | Discharge: 2014-07-28 | Disposition: A | Discharge status: Home / Self Care | Payer: No Typology Code available for payment source | Source: Ambulatory Visit | Attending: Obstetrics and Gynecology | Admitting: Obstetrics and Gynecology

## 2014-07-28 ENCOUNTER — Ambulatory Visit: Payer: No Typology Code available for payment source

## 2014-07-28 ENCOUNTER — Encounter (HOSPITAL_COMMUNITY): Payer: Self-pay

## 2014-07-28 ENCOUNTER — Other Ambulatory Visit (HOSPITAL_COMMUNITY): Payer: Self-pay | Admitting: Obstetrics and Gynecology

## 2014-07-28 ENCOUNTER — Encounter: Payer: Self-pay | Admitting: Hematology

## 2014-07-28 ENCOUNTER — Telehealth: Payer: Self-pay | Admitting: Hematology

## 2014-07-28 ENCOUNTER — Ambulatory Visit (HOSPITAL_BASED_OUTPATIENT_CLINIC_OR_DEPARTMENT_OTHER): Payer: No Typology Code available for payment source | Admitting: Hematology

## 2014-07-28 VITALS — BP 126/69 | HR 86 | Temp 98.8°F | Resp 18 | Ht 65.0 in | Wt 133.5 lb

## 2014-07-28 DIAGNOSIS — R0602 Shortness of breath: Secondary | ICD-10-CM | POA: Insufficient documentation

## 2014-07-28 DIAGNOSIS — I82431 Acute embolism and thrombosis of right popliteal vein: Secondary | ICD-10-CM | POA: Insufficient documentation

## 2014-07-28 DIAGNOSIS — I82401 Acute embolism and thrombosis of unspecified deep veins of right lower extremity: Secondary | ICD-10-CM | POA: Diagnosis not present

## 2014-07-28 DIAGNOSIS — Z86711 Personal history of pulmonary embolism: Secondary | ICD-10-CM

## 2014-07-28 DIAGNOSIS — Z86718 Personal history of other venous thrombosis and embolism: Secondary | ICD-10-CM | POA: Diagnosis not present

## 2014-07-28 DIAGNOSIS — I82439 Acute embolism and thrombosis of unspecified popliteal vein: Secondary | ICD-10-CM | POA: Insufficient documentation

## 2014-07-28 DIAGNOSIS — M79669 Pain in unspecified lower leg: Secondary | ICD-10-CM

## 2014-07-28 DIAGNOSIS — R079 Chest pain, unspecified: Secondary | ICD-10-CM

## 2014-07-28 LAB — COMPREHENSIVE METABOLIC PANEL (CC13)
ALT: 16 U/L (ref 0–55)
AST: 14 U/L (ref 5–34)
Albumin: 3.8 g/dL (ref 3.5–5.0)
Alkaline Phosphatase: 50 U/L (ref 40–150)
Anion Gap: 7 mEq/L (ref 3–11)
BUN: 12.8 mg/dL (ref 7.0–26.0)
CO2: 27 mEq/L (ref 22–29)
Calcium: 9.4 mg/dL (ref 8.4–10.4)
Chloride: 107 mEq/L (ref 98–109)
Creatinine: 0.7 mg/dL (ref 0.6–1.1)
Glucose: 106 mg/dl (ref 70–140)
Potassium: 3.7 mEq/L (ref 3.5–5.1)
Sodium: 141 mEq/L (ref 136–145)
Total Bilirubin: 0.3 mg/dL (ref 0.20–1.20)
Total Protein: 7.9 g/dL (ref 6.4–8.3)

## 2014-07-28 LAB — CBC WITH DIFFERENTIAL/PLATELET
BASO%: 0.3 % (ref 0.0–2.0)
Basophils Absolute: 0 10*3/uL (ref 0.0–0.1)
EOS%: 1.8 % (ref 0.0–7.0)
Eosinophils Absolute: 0.1 10*3/uL (ref 0.0–0.5)
HCT: 37.1 % (ref 34.8–46.6)
HGB: 12.6 g/dL (ref 11.6–15.9)
LYMPH%: 24 % (ref 14.0–49.7)
MCH: 31.8 pg (ref 25.1–34.0)
MCHC: 34 g/dL (ref 31.5–36.0)
MCV: 93.7 fL (ref 79.5–101.0)
MONO#: 0.6 10*3/uL (ref 0.1–0.9)
MONO%: 8.5 % (ref 0.0–14.0)
NEUT#: 4.3 10*3/uL (ref 1.5–6.5)
NEUT%: 65.4 % (ref 38.4–76.8)
Platelets: 277 10*3/uL (ref 145–400)
RBC: 3.96 10*6/uL (ref 3.70–5.45)
RDW: 12.3 % (ref 11.2–14.5)
WBC: 6.5 10*3/uL (ref 3.9–10.3)
lymph#: 1.6 10*3/uL (ref 0.9–3.3)

## 2014-07-28 MED ORDER — ENOXAPARIN SODIUM 100 MG/ML ~~LOC~~ SOLN
90.0000 mg | Freq: Once | SUBCUTANEOUS | Status: AC
  Administered 2014-07-28: 90 mg via SUBCUTANEOUS
  Filled 2014-07-28: qty 1

## 2014-07-28 MED ORDER — IOHEXOL 350 MG/ML SOLN
100.0000 mL | Freq: Once | INTRAVENOUS | Status: AC | PRN
  Administered 2014-07-28: 100 mL via INTRAVENOUS

## 2014-07-28 MED ORDER — RIVAROXABAN (XARELTO) VTE STARTER PACK (15 & 20 MG): ORAL_TABLET | ORAL | Status: DC

## 2014-07-28 NOTE — Telephone Encounter (Signed)
LM to confirm appt d/t for Feb 2016.

## 2014-07-28 NOTE — Patient Instructions (Signed)
Enoxaparin injection  What is this medicine?  ENOXAPARIN (ee nox a PA rin) is used after knee, hip, or abdominal surgeries to prevent blood clotting. It is also used to treat existing blood clots in the lungs or in the veins.  This medicine may be used for other purposes; ask your health care provider or pharmacist if you have questions.  COMMON BRAND NAME(S): Lovenox  What should I tell my health care provider before I take this medicine?  They need to know if you have any of these conditions:  -bleeding disorders, hemorrhage, or hemophilia  -infection of the heart or heart valves  -kidney or liver disease  -previous stroke  -prosthetic heart valve  -recent surgery or delivery of a baby  -ulcer in the stomach or intestine, diverticulitis, or other bowel disease  -an unusual or allergic reaction to enoxaparin, heparin, pork or pork products, other medicines, foods, dyes, or preservatives  -pregnant or trying to get pregnant  -breast-feeding  How should I use this medicine?  This medicine is for injection under the skin. It is usually given by a health-care professional. You or a family member may be trained on how to give the injections. If you are to give yourself injections, make sure you understand how to use the syringe, measure the dose if necessary, and give the injection. To avoid bruising, do not rub the site where this medicine has been injected. Do not take your medicine more often than directed. Do not stop taking except on the advice of your doctor or health care professional.  Make sure you receive a puncture-resistant container to dispose of the needles and syringes once you have finished with them. Do not reuse these items. Return the container to your doctor or health care professional for proper disposal.  Talk to your pediatrician regarding the use of this medicine in children. Special care may be needed.  Overdosage: If you think you have taken too much of this medicine contact a poison control  center or emergency room at once.  NOTE: This medicine is only for you. Do not share this medicine with others.  What if I miss a dose?  If you miss a dose, take it as soon as you can. If it is almost time for your next dose, take only that dose. Do not take double or extra doses.  What may interact with this medicine?  -aspirin and aspirin-like medicines  -certain medicines that treat or prevent blood clots  -dipyridamole  -NSAIDs, medicines for pain and inflammation, like ibuprofen or naproxen  This list may not describe all possible interactions. Give your health care provider a list of all the medicines, herbs, non-prescription drugs, or dietary supplements you use. Also tell them if you smoke, drink alcohol, or use illegal drugs. Some items may interact with your medicine.  What should I watch for while using this medicine?  Visit your doctor or health care professional for regular checks on your progress. Your condition will be monitored carefully while you are receiving this medicine.  Notify your doctor or health care professional and seek emergency treatment if you develop breathing problems; changes in vision; chest pain; severe, sudden headache; pain, swelling, warmth in the leg; trouble speaking; sudden numbness or weakness of the face, arm, or leg. These can be signs that your condition has gotten worse.  If you are going to have surgery, tell your doctor or health care professional that you are taking this medicine.  Do not stop taking   this medicine without first talking to your doctor. Be sure to refill your prescription before you run out of medicine.  Avoid sports and activities that might cause injury while you are using this medicine. Severe falls or injuries can cause unseen bleeding. Be careful when using sharp tools or knives. Consider using an electric razor. Take special care brushing or flossing your teeth. Report any injuries, bruising, or red spots on the skin to your doctor or health care  professional.  What side effects may I notice from receiving this medicine?  Side effects that you should report to your doctor or health care professional as soon as possible:  -allergic reactions like skin rash, itching or hives, swelling of the face, lips, or tongue  -feeling faint or lightheaded, falls  -signs and symptoms of bleeding such as bloody or black, tarry stools; red or dark-brown urine; spitting up blood or brown material that looks like coffee grounds; red spots on the skin; unusual bruising or bleeding from the eye, gums, or nose  Side effects that usually do not require medical attention (report to your doctor or health care professional if they continue or are bothersome):  -pain, redness, or irritation at site where injected  This list may not describe all possible side effects. Call your doctor for medical advice about side effects. You may report side effects to FDA at 1-800-FDA-1088.  Where should I keep my medicine?  Keep out of the reach of children.  Store at room temperature between 15 and 30 degrees C (59 and 86 degrees F). Do not freeze. If your injections have been specially prepared, you may need to store them in the refrigerator. Ask your pharmacist. Throw away any unused medicine after the expiration date.  NOTE: This sheet is a summary. It may not cover all possible information. If you have questions about this medicine, talk to your doctor, pharmacist, or health care provider.  © 2015, [employer]. (2013-12-28 16:06:21)

## 2014-07-28 NOTE — Telephone Encounter (Signed)
S/W CHRISTY @ [facility] AND GAVE NEW PATIENT APPT FOR 11/19 @ 3 W/DR. SEHBAI. PER MD TO SCHEDULE

## 2014-07-28 NOTE — Progress Notes (Signed)
[facility]AT[facility] CONSULT NOTE DATE OF VISIT: 07/28/2014  Patient Care Team: Marvene Staff, MD as PCP - General (Obstetrics and Gynecology)  CHIEF COMPLAINTS/PURPOSE OF CONSULTATION:   New diagnosis of Right Leg DVT  HISTORY OF PRESENTING ILLNESS:   Julie Johns 43 y.o. female from [place] is being referred here for diagnosis of a Right sided DVT. 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 She returned on 07/24/2014 that was 4 days ago. She started noticing some discomfort and pain in her right leg. It was a complaint by symptoms of sweats and feeling cold which she describes as flulike symptoms. She knew that something is wrong and it was similar to when she had a pulmonary embolism in 2013 after having a hysterectomy. She brought it to the attention of Dr. Garwin Brothers her OB/GYN.   A bilateral lower extremity venous duplex evaluation took place on 07/28/2014 at [facility]. The preliminary report indicates an acute thrombus in the right popliteal vein. There is no evidence of DVT involving the left lower extremity. There is no evidence of superficial thrombosis involving the right and left lower extremity. There is no evidence of Baker's cyst on the right or left.   A CT Darliss Cheney chest with PE protocol was performed on 07/28/2014 and showed no evidence of acute PE. There was no infiltrate or effusion. There is a stable 3 mm noncalcified nodule in the right lower lobe, consistent with a benign process. This nodule is unchanged compared to a CT chest of 06/03/2012.  When patient had a PE in September 2013, it was involving the right lower lobe pulmonary artery. Additional possible tiny subsegmental pulmonary emboli was seen in the right upper lobe pulmonary artery. The overall clot burden was small to moderate. That PE happened 5 days after her hysterectomy. A venous duplex study done on 06/03/2012  did not show any thrombus in either lower extremity. At that time her workup included testing for factor V Leiden mutation, MTHFR mutation, prothrombin gene mutation, lupus anticoagulant, beta 2 glycoprotein antibodies, anticardiolipin antibodies, antithrombin III, protein C, protein S and that workup was negative. She took Coumadin for about 6 months and was taken off it after that. Patient is currently not taking any medicines. She does not live a sedentary lifestyle in fact she is very active thin and slim. This DVT is going to be called a provoked DVT caused by long-distance traveling. Since this is the second time it happened, one can consider lifelong anticoagulation. My recommendation would be to give her Xarelto for 6 months and then recheck her venous duplex study and d-dimer. If her venous duplex is negative for DVT and d-dimer is normal and there is no evidence of post-thrombotic syndrome clinically, I will switch her from Xarelto to aspirin daily.  Patient did undergo a screening bilateral digital mammography on 10/22/2013 recommending additional views of the left breast. A diagnostic mammogram with ultrasound done on 11/02/2013 was Classified as BI-RADS Category 2 benign mammogram. A chest x-ray performed today on 07/28/2014 showed no acute cardiopulmonary abnormality.  In essence, she had 2 thrombo-embolic events that is the primary embolism in 2013 precipitated by surgery and a DVT now precipitated by long-distance traveling and does not appear to have a thrombophilia or hypercoagulable disorder based on her lab testing and her family history.  MEDICAL HISTORY:  Past Medical History  Diagnosis Date  . Uterine fibroid   .  Thrombophlebitis - postoperative   . PE (pulmonary thromboembolism)   . Anemia     after the TAH Not now    SURGICAL HISTORY: Past Surgical History  Procedure Laterality Date  . Lasik    . Myomectomy  2000  . Tubal ligation  2005  . Mouth surgery    . Cesarean  section  2001, 2005  . Tubal ligation    . Novasure ablation    . Abdominal hysterectomy      SOCIAL HISTORY: History   Social History  . Marital Status: Married    Spouse Name: N/A    Number of Children: 2  . Years of Education: College   Occupational History  .      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 Smoking status: Never Smoker   . Smokeless tobacco: Never Used  . Alcohol Use: 0.0 oz/week    0 Not specified per week     Comment: occasional  . Drug Use: No  . Sexual Activity:    Partners: Male    Birth Control/ Protection: Surgical   Other Topics Concern  . 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 She has two boys ages 65 and 75. She is physically active. She recently travelled to [place] for 8 days and had good time there.    FAMILY HISTORY: Family History  Problem Relation Age of Onset  . Thrombophlebitis Mother   . Prostate cancer Father     died from alzhmier's disease  . Miscarriages / Stillbirths Sister     1 sister had miscarriage.    ALLERGIES:  is allergic to morphine and related; percocet; stadol; latex; and sulfa antibiotics.  MEDICATIONS:  Current Outpatient Prescriptions  Medication Sig Dispense Refill  . Rivaroxaban (XARELTO STARTER PACK) 15 & 20 MG TBPK Take as directed on package: Start with one 15mg  tablet by mouth twice a day with food. On Day 22, switch to one 20mg  tablet once a day with food. 51 each 0   No current facility-administered medications for this visit.    REVIEW OF SYSTEMS:   Constitutional: Denies fevers, chills, + abnormal sweats 2-3 days Eyes: Denies blurriness of vision, double vision or watery eyes Ears, nose, mouth, throat, and face: Denies mucositis or sore throat Respiratory: Denies cough, dyspnea or wheezes Cardiovascular: Denies palpitation, chest discomfort or lower extremity  swelling Gastrointestinal:  Denies nausea, heartburn or change in bowel habits Skin: Denies abnormal skin rashes Lymphatics: Denies new lymphadenopathy or easy bruising Neurological:Denies numbness, tingling or new weaknesses Behavioral/Psych: Mood is stable, no new changes  All other systems were reviewed with the patient and are negative.  PHYSICAL EXAMINATION: ECOG PERFORMANCE STATUS: 0  Filed Vitals:   07/28/14 1515  BP: 126/69  Pulse: 86  Temp: 98.8 F (37.1 C)  Resp: 18   Filed Weights   07/28/14 1515  Weight: 133 lb 8 oz (60.555 kg)    GENERAL:alert, no distress and comfortable SKIN: skin color, texture, turgor are normal, no rashes or significant lesions EYES: normal, conjunctiva are pink and non-injected, sclera clear OROPHARYNX:no exudate, no erythema and lips, buccal mucosa, and tongue normal  NECK: supple, thyroid normal size, non-tender, without nodularity LYMPH:  no palpable lymphadenopathy in the cervical, axillary or inguinal LUNGS: clear to auscultation and percussion with normal breathing effort HEART: regular rate & rhythm and no murmurs and no lower  extremity edema ABDOMEN:abdomen soft, non-tender and normal bowel sounds Musculoskeletal:no cyanosis of digits and no clubbing, no edema, neg homan's sign PSYCH: alert & oriented x 3 with fluent speech NEURO: no focal motor/sensory deficits  LABORATORY DATA:  I have reviewed the data as listed Lab Results  Component Value Date   WBC 6.5 07/28/2014   HGB 12.6 07/28/2014   HCT 37.1 07/28/2014   MCV 93.7 07/28/2014   PLT 277 07/28/2014    Recent Labs  07/28/14 1624  NA 141  K 3.7  CO2 27  GLUCOSE 106  BUN 12.8  CREATININE 0.7  CALCIUM 9.4  PROT 7.9  ALBUMIN 3.8  AST 14  ALT 16  ALKPHOS 50  BILITOT 0.30    RADIOGRAPHIC STUDIES: I have personally reviewed the radiological images as listed and agreed with the findings in the report. Dg Chest 2 View  07/28/2014   CLINICAL DATA:  Chest  pain, unspecified chest pain type.  EXAM: CHEST  2 VIEW  COMPARISON:  None.  FINDINGS: The heart size and mediastinal contours are within normal limits. Both lungs are clear. No pneumothorax or pleural effusion is noted. The visualized skeletal structures are unremarkable.  IMPRESSION: No acute cardiopulmonary abnormality seen.   Electronically Signed   By: Sabino Dick M.D.   On: 07/28/2014 11:16   Ct Angio Chest Pe W/cm &/or Wo Cm  07/28/2014   CLINICAL DATA:  Chest pain, spitting up blood, shortness of breath  EXAM: CT ANGIOGRAPHY CHEST WITH CONTRAST  TECHNIQUE: Multidetector CT imaging of the chest was performed using the standard protocol during bolus administration of intravenous contrast. Multiplanar CT image reconstructions and MIPs were obtained to evaluate the vascular anatomy.  CONTRAST:  169mL OMNIPAQUE IOHEXOL 350 MG/ML SOLN  COMPARISON:  Chest x-ray of 07/28/2014, and CT chest of 06/03/2012  FINDINGS: The pulmonary arteries opacify well and there is no evidence of acute pulmonary embolism. The thoracic aorta opacifies with no abnormality noted. The origins of the great vessels are patent. No mediastinal or hilar adenopathy is seen. The upper abdomen is unremarkable. The thyroid gland is normal in size.  On lung window images, there is a 3 mm noncalcified nodule in the right lower lobe just above the hemidiaphragm on image number 73 which compared to the prior CT is unchanged and consistent with a benign process. No other lung nodule is seen. No parenchymal infiltrate is noted, and there is no evidence of pleural effusion. The central airway is patent.  Review of the MIP images confirms the above findings.  IMPRESSION: 1. No evidence of acute pulmonary embolism. 2. No infiltrate or effusion. 3. Stable 3 mm noncalcified nodule in the right lower lobe, consistent with a benign process.   Electronically Signed   By: Ivar Drape M.D.   On: 07/28/2014 12:02    ASSESSMENT & PLAN:   46. 43 years old  female with a new right leg DVT and a prior history of pulmonary embolism in 2013 following her hysterectomy. Her thrombophilia workup done in 2013 was negative. She was treated with 6 months of Coumadin.  2. I gave her a shot of Lovenox in the office 90 mg subcutaneous dose based on 1.5 mg/kg body weight.  3. She will start Xarelto tomorrow 15 mg oral twice a day for 21 days and then start taking 20 mg daily. I'm recommending 6 months of Xarelto. We did some baseline labs that his CBC and CMP in the office today which were normal. A d-dimer  was also done and it is pending at this time. Patient understands the side effects of Xarelto.  4. We will have her come back in 3 months for a clinic visit with Dr Burr Medico and to repeat her labs.  5. I will recommend getting a venous duplex and a d-dimer in 6 months and if those studies are negative, then to switch her to aspirin daily. I also discussed about lifestyle modification and in the future if she needs to do long-distance traveling either by car or airplane she can consider getting Lovenox shots. She should also get age appropriate screenings.    Orders Placed This Encounter  Procedures  . CBC with Differential    Standing Status: Future     Number of Occurrences: 1     Standing Expiration Date: 07/28/2016  . Comprehensive metabolic panel (Cmet) - [facility]    Standing Status: Future     Number of Occurrences: 1     Standing Expiration Date: 07/28/2016  . D-dimer, quantitative    Standing Status: Future     Number of Occurrences: 1     Standing Expiration Date: 07/28/2016  . CBC with Differential    Standing Status: Future     Number of Occurrences: 1     Standing Expiration Date: 07/28/2016  . Comprehensive metabolic panel (Cmet) - [facility]    Standing Status: Future     Number of Occurrences: 1     Standing Expiration Date: 07/28/2016  . D-dimer, quantitative    Standing Status: Future     Number of Occurrences: 1     Standing Expiration  Date: 07/28/2016    All questions were answered. The patient knows to call the clinic with any problems, questions or concerns. I spent 30 minutes counseling the patient face to face. The total time spent in the appointment was 60 minutes and more than 50% was on counseling.   Bernadene Bell, MD Medical Hematologist/Oncologist [place] Pager: [phone] Office No: [phone]

## 2014-07-28 NOTE — Progress Notes (Signed)
VASCULAR LAB PRELIMINARY  PRELIMINARY  PRELIMINARY  PRELIMINARY  Bilateral lower extremity venous duplex completed.    Preliminary report:  Right - Positive for an acute DVT of the popliteal vein. No evidence of a superficial thrombosis or Baker's cyst.  Left:  No evidence of DVT, superficial thrombosis, or Baker's cyst.  Deaunte Dente, RVS 07/28/2014, 11:19 AM

## 2014-07-29 ENCOUNTER — Telehealth: Payer: Self-pay | Admitting: Hematology

## 2014-07-29 LAB — D-DIMER, QUANTITATIVE: D-Dimer, Quant: 0.51 ug/mL-FEU — ABNORMAL HIGH (ref 0.00–0.48)

## 2014-07-29 MED ORDER — RIVAROXABAN 20 MG PO TABS: 20.0000 mg | ORAL_TABLET | Freq: Every day | ORAL | Status: DC

## 2014-07-29 NOTE — Addendum Note (Signed)
Addended by: Hayes Ludwig on: 07/29/2014 10:25 AM   Modules accepted: Orders

## 2014-07-29 NOTE — Telephone Encounter (Signed)
Mailed out cal on 07/28/14.

## 2014-10-27 ENCOUNTER — Telehealth: Payer: Self-pay | Admitting: Hematology

## 2014-10-27 ENCOUNTER — Encounter: Payer: No Typology Code available for payment source | Admitting: Hematology

## 2014-10-27 ENCOUNTER — Other Ambulatory Visit: Payer: No Typology Code available for payment source

## 2014-10-27 NOTE — Telephone Encounter (Signed)
Left message to confirm appointment for 03/10. Mailed calendar.

## 2014-11-17 ENCOUNTER — Other Ambulatory Visit (HOSPITAL_BASED_OUTPATIENT_CLINIC_OR_DEPARTMENT_OTHER): Payer: Self-pay

## 2014-11-17 ENCOUNTER — Encounter: Payer: Self-pay | Admitting: Hematology

## 2014-11-17 ENCOUNTER — Ambulatory Visit (HOSPITAL_BASED_OUTPATIENT_CLINIC_OR_DEPARTMENT_OTHER): Payer: Self-pay | Admitting: Hematology

## 2014-11-17 VITALS — BP 126/71 | HR 79 | Temp 98.8°F | Resp 18 | Ht 65.0 in | Wt 131.9 lb

## 2014-11-17 DIAGNOSIS — I82439 Acute embolism and thrombosis of unspecified popliteal vein: Secondary | ICD-10-CM

## 2014-11-17 DIAGNOSIS — Z86711 Personal history of pulmonary embolism: Secondary | ICD-10-CM

## 2014-11-17 DIAGNOSIS — Z86718 Personal history of other venous thrombosis and embolism: Secondary | ICD-10-CM

## 2014-11-17 LAB — COMPREHENSIVE METABOLIC PANEL (CC13)
ALT: 11 U/L (ref 0–55)
AST: 15 U/L (ref 5–34)
Albumin: 3.7 g/dL (ref 3.5–5.0)
Alkaline Phosphatase: 46 U/L (ref 40–150)
Anion Gap: 9 mEq/L (ref 3–11)
BUN: 12.8 mg/dL (ref 7.0–26.0)
CO2: 26 mEq/L (ref 22–29)
Calcium: 8.9 mg/dL (ref 8.4–10.4)
Chloride: 106 mEq/L (ref 98–109)
Creatinine: 0.7 mg/dL (ref 0.6–1.1)
EGFR: >90 mL/min/{1.73_m2} (ref >90)
Glucose: 96 mg/dl (ref 70–140)
Potassium: 4 mEq/L (ref 3.5–5.1)
Sodium: 141 mEq/L (ref 136–145)
Total Bilirubin: 0.72 mg/dL (ref 0.20–1.20)
Total Protein: 7.8 g/dL (ref 6.4–8.3)

## 2014-11-17 LAB — CBC WITH DIFFERENTIAL/PLATELET
BASO%: 0.4 % (ref 0.0–2.0)
Basophils Absolute: 0 10*3/uL (ref 0.0–0.1)
EOS%: 2.1 % (ref 0.0–7.0)
Eosinophils Absolute: 0.1 10*3/uL (ref 0.0–0.5)
HCT: 37.3 % (ref 34.8–46.6)
HGB: 12.6 g/dL (ref 11.6–15.9)
LYMPH%: 19.5 % (ref 14.0–49.7)
MCH: 32.3 pg (ref 25.1–34.0)
MCHC: 33.8 g/dL (ref 31.5–36.0)
MCV: 95.6 fL (ref 79.5–101.0)
MONO#: 0.5 10*3/uL (ref 0.1–0.9)
MONO%: 8.4 % (ref 0.0–14.0)
NEUT#: 3.7 10*3/uL (ref 1.5–6.5)
NEUT%: 69.6 % (ref 38.4–76.8)
Platelets: 237 10*3/uL (ref 145–400)
RBC: 3.9 10*6/uL (ref 3.70–5.45)
RDW: 12.1 % (ref 11.2–14.5)
WBC: 5.3 10*3/uL (ref 3.9–10.3)
lymph#: 1 10*3/uL (ref 0.9–3.3)

## 2014-11-17 NOTE — Progress Notes (Signed)
[facility]ATOLOG[facility] CONSULT NOTE DATE OF VISIT: 07/28/2014  Patient Care Team: Servando Salina, MD as PCP - General (Obstetrics and Gynecology)  CHIEF COMPLAINTS/PURPOSE OF CONSULTATION:  Follow up PE/DVT   HISTORY OF PRESENTING ILLNESS:   Julie Johns 44 y.o. female from [place] is being referred here for diagnosis of a Right sided DVT. 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 She returned on 07/24/2014 that was 4 days ago. She started noticing some discomfort and pain in her right leg. It was a complaint by symptoms of sweats and feeling cold which she describes as flulike symptoms. She knew that something is wrong and it was similar to when she had a pulmonary embolism in 2013 after having a hysterectomy. She brought it to the attention of Dr. Garwin Brothers her OB/GYN.   A bilateral lower extremity venous duplex evaluation took place on 07/28/2014 at [facility]. The preliminary report indicates an acute thrombus in the right popliteal vein. There is no evidence of DVT involving the left lower extremity. There is no evidence of superficial thrombosis involving the right and left lower extremity. There is no evidence of Baker's cyst on the right or left.   A CT Darliss Cheney chest with PE protocol was performed on 07/28/2014 and showed no evidence of acute PE. There was no infiltrate or effusion. There is a stable 3 mm noncalcified nodule in the right lower lobe, consistent with a benign process. This nodule is unchanged compared to a CT chest of 06/03/2012.  When patient had a PE in September 2013, it was involving the right lower lobe pulmonary artery. Additional possible tiny subsegmental pulmonary emboli was seen in the right upper lobe pulmonary artery. The overall clot burden was small to moderate. That PE happened 5 days after her hysterectomy. A venous duplex study done on 06/03/2012 did not show any  thrombus in either lower extremity. At that time her workup included testing for factor V Leiden mutation, MTHFR mutation, prothrombin gene mutation, lupus anticoagulant, beta 2 glycoprotein antibodies, anticardiolipin antibodies, antithrombin III, protein C, protein S and that workup was negative. She took Coumadin for about 6 months and was taken off it after that. Patient is currently not taking any medicines. She does not live a sedentary lifestyle in fact she is very active thin and slim. This DVT is going to be called a provoked DVT caused by long-distance traveling. Since this is the second time it happened, one can consider lifelong anticoagulation. My recommendation would be to give her Xarelto for 6 months and then recheck her venous duplex study and d-dimer. If her venous duplex is negative for DVT and d-dimer is normal and there is no evidence of post-thrombotic syndrome clinically, I will switch her from Xarelto to aspirin daily.  Patient did undergo a screening bilateral digital mammography on 10/22/2013 recommending additional views of the left breast. A diagnostic mammogram with ultrasound done on 11/02/2013 was Classified as BI-RADS Category 2 benign mammogram. A chest x-ray performed today on 07/28/2014 showed no acute cardiopulmonary abnormality.  In essence, she had 2 thrombo-embolic events that is the primary embolism in 2013 precipitated by surgery and a DVT now precipitated by long-distance traveling and does not appear to have a thrombophilia or hypercoagulable disorder based on her lab testing and her family history.  INTERIM HISTORY: She returns for follow up. She had xarelto for 2 month but she stopped due to the side  effects. She has some flank pain, malaise when she was on Xarelto and the symptoms resolved after she stopped. She did start aspirin 325 mg once daily after she stopped Xarelto. She has been doing very well lately, no chest pain, dyspnea, leg swelling or pain.  The  MEDICAL HISTORY:  Past Medical History  Diagnosis Date  . Uterine fibroid   . Thrombophlebitis - postoperative   . PE (pulmonary thromboembolism)   . Anemia     after the TAH Not now    SURGICAL HISTORY: Past Surgical History  Procedure Laterality Date  . Lasik    . Myomectomy  2000  . Tubal ligation  2005  . Mouth surgery    . Cesarean section  2001, 2005  . Tubal ligation    . Novasure ablation    . Abdominal hysterectomy      SOCIAL HISTORY: History   Social History  . Marital Status: Married    Spouse Name: N/A  . Number of Children: 2  . Years of Education: College   Occupational History  .      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 Smoking status: Never Smoker   . Smokeless tobacco: Never Used  . Alcohol Use: 0.0 oz/week    0 Standard drinks or equivalent per week     Comment: occasional  . Drug Use: No  . Sexual Activity:    Partners: Male    Birth Control/ Protection: Surgical   Other Topics Concern  . 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 She has two boys ages 44 and 56. She is physically active. She recently travelled to [place] for 8 days and had good time there.    FAMILY HISTORY: Family History  Problem Relation Age of Onset  . Thrombophlebitis Mother   . Prostate cancer Father     died from alzhmier's disease  . Miscarriages / Stillbirths Sister     1 sister had miscarriage.    ALLERGIES:  is allergic to morphine and related; percocet; stadol; latex; and sulfa antibiotics.  MEDICATIONS:  Current Outpatient Prescriptions  Medication Sig Dispense Refill  . aspirin 325 MG tablet Take 325 mg by mouth daily.     No current facility-administered medications for this visit.    REVIEW OF SYSTEMS:   Constitutional: Denies fevers, chills, + abnormal sweats 2-3 days Eyes: Denies blurriness of vision, double vision or  watery eyes Ears, nose, mouth, throat, and face: Denies mucositis or sore throat Respiratory: Denies cough, dyspnea or wheezes Cardiovascular: Denies palpitation, chest discomfort or lower extremity swelling Gastrointestinal:  Denies nausea, heartburn or change in bowel habits Skin: Denies abnormal skin rashes Lymphatics: Denies new lymphadenopathy or easy bruising Neurological:Denies numbness, tingling or new weaknesses Behavioral/Psych: Mood is stable, no new changes  All other systems were reviewed with the patient and are negative.  PHYSICAL EXAMINATION: ECOG PERFORMANCE STATUS: 0  Filed Vitals:   11/17/14 0936  BP: 126/71  Pulse: 79  Temp: 98.8 F (37.1 C)  Resp: 18   Filed Weights   11/17/14 0936  Weight: 131 lb 14.4 oz (59.829 kg)    GENERAL:alert, no distress and comfortable SKIN: skin color, texture, turgor are normal, no rashes or significant lesions EYES: normal, conjunctiva are pink and non-injected, sclera clear OROPHARYNX:no exudate, no erythema and lips, buccal mucosa, and tongue normal  NECK: supple, thyroid normal size,  non-tender, without nodularity LYMPH:  no palpable lymphadenopathy in the cervical, axillary or inguinal LUNGS: clear to auscultation and percussion with normal breathing effort HEART: regular rate & rhythm and no murmurs and no lower extremity edema ABDOMEN:abdomen soft, non-tender and normal bowel sounds Musculoskeletal:no cyanosis of digits and no clubbing, no edema, neg homan's sign PSYCH: alert & oriented x 3 with fluent speech NEURO: no focal motor/sensory deficits  LABORATORY DATA:  I have reviewed the data as listed Lab Results  Component Value Date   WBC 5.3 11/17/2014   HGB 12.6 11/17/2014   HCT 37.3 11/17/2014   MCV 95.6 11/17/2014   PLT 237 11/17/2014    Recent Labs  07/28/14 1624 11/17/14 0852  NA 141 141  K 3.7 4.0  CO2 27 26  GLUCOSE 106 96  BUN 12.8 12.8  CREATININE 0.7 0.7  CALCIUM 9.4 8.9  PROT 7.9 7.8   ALBUMIN 3.8 3.7  AST 14 15  ALT 16 11  ALKPHOS 50 46  BILITOT 0.30 0.72    RADIOGRAPHIC STUDIES: I have personally reviewed the radiological images as listed and agreed with the findings in the report. No results found.  ASSESSMENT & PLAN:  44 years old female   1. Provoked right leg popliteal DVT in 07/2014 and a prior history of segmental pulmonary embolism in 2013 following her hysterectomy.  -Her thrombophilia workup done in 2013 was negative. She was treated with 6 months of Coumadin for PE and 2 months of Xarelto for DVT. -Although both episodes were provoked (surgery and long distance travel), she is at higher risk for recurrent thrombosis than general population, it would not be unreasonable for lifelong anticoagulation.  -She is little hesitate to commit for long term anticoagulation, we decided to continue aspirin alone. -We reviewed the strategy to prevent recurrent DVT, including anticoagulation after surgery and before long-distance travel, avoiding smoking or oral contraceptive, be physically active, etc.  Follow-up, I'll see her only as needed in the future. She will follow-up with her primary care physician  All questions were answered. The patient knows to call the clinic with any problems, questions or concerns. I spent 20 minutes counseling the patient face to face. The total time spent in the appointment was 25 minutes and more than 50% was on counseling.  Truitt Merle  11/17/2014

## 2014-11-18 ENCOUNTER — Other Ambulatory Visit: Payer: Self-pay | Admitting: *Deleted

## 2014-11-18 DIAGNOSIS — I82439 Acute embolism and thrombosis of unspecified popliteal vein: Secondary | ICD-10-CM

## 2014-11-19 LAB — D-DIMER, QUANTITATIVE: D-Dimer, Quant: 0.88 ug/mL-FEU — ABNORMAL HIGH (ref 0.00–0.48)

## 2014-11-28 ENCOUNTER — Telehealth: Payer: Self-pay | Admitting: *Deleted

## 2014-11-28 NOTE — Telephone Encounter (Signed)
Called pt on cell phone and left message on voice mail re:  Per Dr. Burr Medico,  D-Dimer results  0.88

## 2014-12-29 ENCOUNTER — Other Ambulatory Visit: Payer: Self-pay

## 2014-12-29 DIAGNOSIS — Z1231 Encounter for screening mammogram for malignant neoplasm of breast: Secondary | ICD-10-CM

## 2015-01-01 NOTE — Progress Notes (Signed)
This encounter was created in error - please disregard.

## 2015-01-10 ENCOUNTER — Encounter (INDEPENDENT_AMBULATORY_CARE_PROVIDER_SITE_OTHER): Payer: Self-pay

## 2015-01-10 ENCOUNTER — Ambulatory Visit
Admission: RE | Admit: 2015-01-10 | Discharge: 2015-01-10 | Disposition: A | Discharge status: Home / Self Care | Payer: 59 | Source: Ambulatory Visit

## 2015-01-10 DIAGNOSIS — Z1231 Encounter for screening mammogram for malignant neoplasm of breast: Secondary | ICD-10-CM

## 2015-12-01 ENCOUNTER — Other Ambulatory Visit: Payer: Self-pay

## 2015-12-01 DIAGNOSIS — Z1231 Encounter for screening mammogram for malignant neoplasm of breast: Secondary | ICD-10-CM

## 2015-12-25 NOTE — Progress Notes (Signed)
HPI: 45 year old female for evaluation of congestive heart failure. Patient has been seen in this office but not since October 2013.  History of pulmonary embolus in September 2013 following hysterectomy. Factor V Leiden negative. Lupus anticoagulant negative. Anti-thrombin 3 normal. Protein C and S normal. Echocardiogram October 2013 showed normal LV function, mild mitral regurgitation. Patient had DVT after traveling in November 2015. Patient was seen by oncology and 6 months of anticoagulation was recommended followed by aspirin afterwards. Patient states that recently she has noticed increased dyspnea with climbing stairs. She also has heart racing and lightheadedness. She otherwise denies orthopnea, PND, pedal edema, syncope or exertional chest pain. She did travel to [place] but used compression hose.  Current Outpatient Prescriptions  Medication Sig Dispense Refill  . aspirin 325 MG tablet Take 325 mg by mouth daily.     No current facility-administered medications for this visit.    Allergies  Allergen Reactions  . Shellfish Allergy Itching    Tongue goes numb.  . Morphine And Related Itching    Severe itching  . Percocet [Oxycodone-Acetaminophen] Nausea And Vomiting    Severe n/v  . Stadol [Butorphanol] Other (See Comments)    hallucinations  . Latex Rash  . Sulfa Antibiotics Rash     Past Medical History  Diagnosis Date  . Uterine fibroid   . Thrombophlebitis - postoperative   . PE (pulmonary thromboembolism) ([place])   . Anemia     after the TAH Not now    Past Surgical History  Procedure Laterality Date  . Lasik    . Myomectomy  2000  . Tubal ligation  2005  . Mouth surgery    . Cesarean section  2001, 2005  . Tubal ligation    . Novasure ablation    . Abdominal hysterectomy      Social History   Social History  . Marital Status: Married    Spouse Name: N/A  . Number of Children: 2  . Years of Education: College   Occupational History  .     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 Smoking status: Never Smoker   . Smokeless tobacco: Never Used  . Alcohol Use: 0.0 oz/week    0 Standard drinks or equivalent per week     Comment: occasional  . Drug Use: No  . Sexual Activity:    Partners: Male    Birth Control/ Protection: Surgical   Other Topics Concern  . 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 She has two boys ages 53 and 67. She is physically active. She recently travelled to [place] for 8 days and had good time there.    Family History  Problem Relation Age of Onset  . Thrombophlebitis Mother   . Hypertension Mother   . Prostate cancer Father     died from alzhmier's disease  . Hypertension Father   . Miscarriages / Stillbirths Sister     1 sister had miscarriage.    ROS: no fevers or chills, productive cough, hemoptysis, dysphasia, odynophagia, melena, hematochezia, dysuria, hematuria, rash, seizure activity, orthopnea, PND, pedal edema, claudication. Remaining systems are negative.  Physical Exam:   Blood pressure 122/84, pulse 68, height 5\' 5"  (1.651 m), weight 135 lb (61.236 kg).  General:  Well developed/well nourished in NAD Skin warm/dry Patient not depressed No peripheral clubbing Back-normal HEENT-normal/normal eyelids Neck supple/normal  carotid upstroke bilaterally; no bruits; no JVD; no thyromegaly chest - CTA/ normal expansion CV - RRR/normal S1 and S2; no murmurs, rubs or gallops;  PMI nondisplaced Abdomen -NT/ND, no HSM, no mass, + bowel sounds, no bruit 2+ femoral pulses, no bruits Ext-no edema, chords, 2+ DP Neuro-grossly nonfocal  ECG NSR, LAE

## 2015-12-26 ENCOUNTER — Ambulatory Visit (INDEPENDENT_AMBULATORY_CARE_PROVIDER_SITE_OTHER): Payer: BLUE CROSS/BLUE SHIELD | Admitting: Cardiology

## 2015-12-26 ENCOUNTER — Encounter: Payer: Self-pay | Admitting: Cardiology

## 2015-12-26 DIAGNOSIS — R0600 Dyspnea, unspecified: Secondary | ICD-10-CM | POA: Insufficient documentation

## 2015-12-26 DIAGNOSIS — R002 Palpitations: Secondary | ICD-10-CM | POA: Insufficient documentation

## 2015-12-26 LAB — D-DIMER, QUANTITATIVE: D-Dimer, Quant: 0.57 ug/mL-FEU — ABNORMAL HIGH (ref 0.00–0.48)

## 2015-12-26 NOTE — Patient Instructions (Signed)
Medication Instructions:   NO CHANGE  Labwork:  Your physician recommends that you HAVE LAB WORK TODAY  Testing/Procedures:  Your physician has requested that you have an echocardiogram. Echocardiography is a painless test that uses sound waves to create images of your heart. It provides your doctor with information about the size and shape of your heart and how well your heart's chambers and valves are working. This procedure takes approximately one hour. There are no restrictions for this procedure.   Your physician has requested that you have an exercise tolerance test. For further information please visit www.cardiosmart.org. Please also follow instruction sheet, as given.    Follow-Up:  Your physician recommends that you schedule a follow-up appointment in: AS NEEDED PENDING TEST RESULTS    Exercise Stress Electrocardiogram An exercise stress electrocardiogram is a test to check how blood flows to your heart. It is done to find areas of poor blood flow. You will need to walk on a treadmill for this test. The electrocardiogram will record your heartbeat when you are at rest and when you are exercising. BEFORE THE PROCEDURE  Do not have drinks with caffeine or foods with caffeine for 24 hours before the test, or as told by your doctor. This includes coffee, tea (even decaf tea), sodas, chocolate, and cocoa.  Follow your doctor's instructions about eating and drinking before the test.  Ask your doctor what medicines you should or should not take before the test. Take your medicines with water unless told by your doctor not to.  If you use an inhaler, bring it with you to the test.  Bring a snack to eat after the test.  Do not  smoke for 4 hours before the test.  Do not put lotions, powders, creams, or oils on your chest before the test.  Wear comfortable shoes and clothing. PROCEDURE  You will have patches put on your chest. Small areas of your chest may need to be shaved.  Wires will be connected to the patches.  Your heart rate will be watched while you are resting and while you are exercising.  You will walk on the treadmill. The treadmill will slowly get faster to raise your heart rate.  The test will take about 1-2 hours. AFTER THE PROCEDURE  Your heart rate and blood pressure will be watched after the test.  You may return to your normal diet, activities, and medicines or as told by your doctor.   This information is not intended to replace advice given to you by your health care provider. Make sure you discuss any questions you have with your health care provider.   Document Released: 02/12/2008 Document Revised: 09/16/2014 Document Reviewed: 05/03/2013 Elsevier Interactive Patient Education 2016 Elsevier Inc.    

## 2015-12-26 NOTE — Assessment & Plan Note (Signed)
Patient notes increased dyspnea on exertion and heart racing with climbing stairs. I will arrange an exercise treadmill to screen for coronary disease and evaluate rhythm with exercise.

## 2015-12-26 NOTE — Assessment & Plan Note (Signed)
Given history of pulmonary embolus and DVT we need to exclude recurrent venous thrombosis. She did travel to [place] recently but her symptoms preceded this. Otherwise no other risk factors. We will arrange a d-dimer. If abnormal she will need venous Dopplers and CTA. I will also check an echocardiogram to reassess LV function.

## 2015-12-28 ENCOUNTER — Ambulatory Visit (INDEPENDENT_AMBULATORY_CARE_PROVIDER_SITE_OTHER)
Admission: RE | Admit: 2015-12-28 | Discharge: 2015-12-28 | Disposition: A | Discharge status: Home / Self Care | Payer: BLUE CROSS/BLUE SHIELD | Source: Ambulatory Visit | Attending: Cardiology | Admitting: Cardiology

## 2015-12-28 ENCOUNTER — Telehealth: Payer: Self-pay | Admitting: *Deleted

## 2015-12-28 ENCOUNTER — Ambulatory Visit (HOSPITAL_COMMUNITY)
Admission: RE | Admit: 2015-12-28 | Discharge: 2015-12-28 | Disposition: A | Discharge status: Home / Self Care | Payer: BLUE CROSS/BLUE SHIELD | Source: Ambulatory Visit | Attending: Cardiology | Admitting: Cardiology

## 2015-12-28 ENCOUNTER — Other Ambulatory Visit: Payer: Self-pay | Admitting: Cardiology

## 2015-12-28 DIAGNOSIS — R0602 Shortness of breath: Secondary | ICD-10-CM

## 2015-12-28 DIAGNOSIS — M79606 Pain in leg, unspecified: Secondary | ICD-10-CM | POA: Diagnosis not present

## 2015-12-28 DIAGNOSIS — R7989 Other specified abnormal findings of blood chemistry: Secondary | ICD-10-CM

## 2015-12-28 DIAGNOSIS — R791 Abnormal coagulation profile: Secondary | ICD-10-CM | POA: Insufficient documentation

## 2015-12-28 MED ORDER — IOHEXOL 350 MG/ML SOLN
80.0000 mL | Freq: Once | INTRAVENOUS | Status: AC | PRN
  Administered 2015-12-28: 80 mL via INTRAVENOUS

## 2015-12-28 NOTE — Telephone Encounter (Signed)
Spoke with pt, Aware of dr Jacalyn Lefevre recommendations.  CTA scheduled for today at the [address] office @ 2:15pm. LEV duplex today at 4:30pm at the [facility] office. Patient voiced understanding of appt times and locations.

## 2015-12-28 NOTE — Telephone Encounter (Signed)
-----   Message from Lelon Perla, MD sent at 12/27/2015  6:08 AM EDT ----- cta of chest to ro pe and lower ext venous dopplers Kirk Ruths

## 2016-01-03 ENCOUNTER — Ambulatory Visit (INDEPENDENT_AMBULATORY_CARE_PROVIDER_SITE_OTHER): Payer: BLUE CROSS/BLUE SHIELD

## 2016-01-03 ENCOUNTER — Other Ambulatory Visit: Payer: Self-pay

## 2016-01-03 ENCOUNTER — Ambulatory Visit (HOSPITAL_COMMUNITY): Payer: BLUE CROSS/BLUE SHIELD | Attending: Cardiovascular Disease

## 2016-01-03 DIAGNOSIS — I340 Nonrheumatic mitral (valve) insufficiency: Secondary | ICD-10-CM | POA: Diagnosis not present

## 2016-01-03 DIAGNOSIS — R0600 Dyspnea, unspecified: Secondary | ICD-10-CM

## 2016-01-03 LAB — EXERCISE TOLERANCE TEST
Estimated workload: 10.1 METS
Exercise duration (min): 8 min
Exercise duration (sec): 0 s
MPHR: 173 {beats}/min
Peak HR: 160 {beats}/min
Percent HR: 92 %
Percent of predicted max HR: 92 %
RPE: 15
Rest HR: 91 {beats}/min
Stage 1 DBP: 72 mmHg
Stage 1 Grade: 0 %
Stage 1 HR: 93 {beats}/min
Stage 1 SBP: 118 mmHg
Stage 1 Speed: 0 mph
Stage 2 Grade: 0 %
Stage 2 HR: 97 {beats}/min
Stage 2 Speed: 1 mph
Stage 3 Grade: 0.1 %
Stage 3 HR: 96 {beats}/min
Stage 3 Speed: 1 mph
Stage 4 DBP: 66 mmHg
Stage 4 Grade: 10 %
Stage 4 HR: 131 {beats}/min
Stage 4 SBP: 142 mmHg
Stage 4 Speed: 1.7 mph
Stage 5 DBP: 62 mmHg
Stage 5 Grade: 12 %
Stage 5 HR: 151 {beats}/min
Stage 5 SBP: 148 mmHg
Stage 5 Speed: 2.5 mph
Stage 6 Grade: 14 %
Stage 6 HR: 160 {beats}/min
Stage 6 Speed: 3.4 mph
Stage 7 DBP: 61 mmHg
Stage 7 Grade: 0 %
Stage 7 HR: 146 {beats}/min
Stage 7 SBP: 150 mmHg
Stage 7 Speed: 1.5 mph
Stage 8 DBP: 74 mmHg
Stage 8 Grade: 0 %
Stage 8 HR: 106 {beats}/min
Stage 8 SBP: 122 mmHg
Stage 8 Speed: 0 mph

## 2016-01-12 ENCOUNTER — Ambulatory Visit
Admission: RE | Admit: 2016-01-12 | Discharge: 2016-01-12 | Disposition: A | Discharge status: Home / Self Care | Payer: BLUE CROSS/BLUE SHIELD | Source: Ambulatory Visit

## 2016-01-12 DIAGNOSIS — Z1231 Encounter for screening mammogram for malignant neoplasm of breast: Secondary | ICD-10-CM

## 2016-01-29 ENCOUNTER — Telehealth: Payer: Self-pay | Admitting: *Deleted

## 2016-01-29 NOTE — Telephone Encounter (Signed)
Patient called and requested an rx for xarelto be sent to [facility] [place]. The medication is not on her current med list, but she stated that her and Dr Stanford Breed had discussed her going back on it. She can be reached at [phone]. Thanks, MI

## 2016-01-31 NOTE — Telephone Encounter (Signed)
Left message for pt to call.

## 2016-02-02 ENCOUNTER — Encounter: Payer: Self-pay | Admitting: Cardiology

## 2016-02-02 NOTE — Telephone Encounter (Signed)
Would provide samples of xarelto (20 mg on each day of flying). Kirk Ruths

## 2016-02-02 NOTE — Telephone Encounter (Signed)
Follow-up    Pt returning the nurses phone left from [place]. Pt did not give another information.

## 2016-02-02 NOTE — Telephone Encounter (Signed)
This encounter was created in error - please disregard.

## 2016-02-02 NOTE — Telephone Encounter (Signed)
Spoke with pt, she is traveling to [place] in June. She said she and dr Stanford Breed had discussed her using xarelto when traveling. Will forward for dr Stanford Breed review

## 2016-02-02 NOTE — Telephone Encounter (Signed)
Spoke with pt, samples placed at the front desk.

## 2016-09-10 ENCOUNTER — Ambulatory Visit (INDEPENDENT_AMBULATORY_CARE_PROVIDER_SITE_OTHER): Payer: BLUE CROSS/BLUE SHIELD | Admitting: Primary Care

## 2016-09-10 ENCOUNTER — Encounter: Payer: Self-pay | Admitting: Primary Care

## 2016-09-10 ENCOUNTER — Encounter (INDEPENDENT_AMBULATORY_CARE_PROVIDER_SITE_OTHER): Payer: Self-pay

## 2016-09-10 VITALS — BP 132/86 | HR 70 | Temp 98.1°F | Ht 64.5 in | Wt 136.0 lb

## 2016-09-10 DIAGNOSIS — Z23 Encounter for immunization: Secondary | ICD-10-CM

## 2016-09-10 DIAGNOSIS — Z0000 Encounter for general adult medical examination without abnormal findings: Secondary | ICD-10-CM

## 2016-09-10 DIAGNOSIS — Z1211 Encounter for screening for malignant neoplasm of colon: Secondary | ICD-10-CM

## 2016-09-10 NOTE — Assessment & Plan Note (Signed)
Occurred in 2015 after long air travel. No symptoms since. Continue ASA 325 mg.

## 2016-09-10 NOTE — Progress Notes (Signed)
Pre visit review using our clinic review tool, if applicable. No additional management support is needed unless otherwise documented below in the visit note. 

## 2016-09-10 NOTE — Patient Instructions (Signed)
Continue your efforts towards a healthy lifestyle.  Start exercising. You should be getting 150 minutes of moderate intensity exercise weekly.  We will repeat your mammogram in May 2018.  You will be contacted regarding your referral to GI for the colonoscopy.  Please let us know if you have not heard back within one week.   You were provided with an influenza vaccination today.  Please bring me a copy of your labs at your convenience.  Follow up in 1 year for your annual exam or sooner if needed.  It was a pleasure to meet you today! Please don't hesitate to call me with any questions. Welcome to [facility]!

## 2016-09-10 NOTE — Assessment & Plan Note (Signed)
Occurred in 2013 after hysterectomy. No problems since. Managed on Aspirin 325 mg daily.

## 2016-09-10 NOTE — Progress Notes (Signed)
Subjective:    Patient ID: Julie Johns, female    DOB: 01-07-1971, 46 y.o.   MRN: [id number]  HPI  Julie Johns is a 46 year old female who presents today to establish care and for complete physical.   1) Pulmonary Embolism: Occurred in 2013 after her hysterectomy. Julie Johns flew to [place] in 2015 and had a DVT. Julie Johns was previously managed on Xarelto and Coumadin. Julie Johns had complete work up for blood factors which were negative. Julie Johns is currently managed on Aspirin 325 mg.  2) Family History of Colon Cancer: Present in 2 second cousins. Julie Johns would like a screening colonoscopy as Julie Johns's recently found the guidelines have changed for Colon Cancer screening in African American population. Julie Johns denies rectal bleeding, unexplained weight loss, diarrhea, fatigue.   Immunizations: -Tetanus: UTD. -Influenza: Due today.   Diet: Julie Johns endorses a healthy diet. Breakfast: Oatmeal, coffee, sausage/eggs Lunch: Sandwich, salad Dinner: Meat, vegetables, occasional starch Snacks: None Desserts: None Beverages: Water, un-sweet tea, coffee  Exercise: Julie Johns does Zumba twice weekly. Eye exam: Completed several years ago. History of Lasik. Dental exam: Completes semi-annually. Colonoscopy: Never completed, would like to have done.  Pap Smear: Hysterecomy Mammogram: Completed in May 2017.   Review of Systems  Constitutional: Negative for unexpected weight change.  HENT: Negative for rhinorrhea.   Respiratory: Negative for cough and shortness of breath.   Cardiovascular: Negative for chest pain.  Gastrointestinal: Negative for constipation and diarrhea.  Genitourinary: Negative for difficulty urinating and menstrual problem.  Musculoskeletal: Negative for arthralgias and myalgias.  Skin: Negative for rash.  Allergic/Immunologic: Negative for environmental allergies.  Neurological: Negative for dizziness, numbness and headaches.       Past Medical History:  Diagnosis Date  . Anemia    after the TAH  Not now  . PE (pulmonary thromboembolism) ([place])   . Thrombophlebitis - postoperative   . Uterine fibroid      Social History   Social History  . Marital status: Married    Spouse name: N/A  . Number of children: 2  . Years of education: College   Occupational History  .  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 Smoking status: Never Smoker  . Smokeless tobacco: Never Used  . Alcohol use 0.0 oz/week     Comment: occasional  . Drug use: No  . Sexual activity: Yes    Partners: Male    Birth control/ protection: Surgical   Other Topics Concern  . Not on file   Social History Narrative   Married.   2 children.   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 Julie Johns has two boys ages 41 and 17. Julie Johns is physically active. Julie Johns recently travelled to [place] for 8 days and had good time there.   Enjoys photography, Oncologist books.    Past Surgical History:  Procedure Laterality Date  . ABDOMINAL HYSTERECTOMY    . CESAREAN SECTION  2001, 2005  . LASIK    . MOUTH SURGERY    . MYOMECTOMY  2000  . NOVASURE ABLATION    . TUBAL LIGATION  2005  . TUBAL LIGATION      Family History  Problem Relation Age of Onset  . Hypertension Mother   . Prostate cancer Father     died from alzhmier's disease  . Hypertension Father   . Miscarriages /  Stillbirths Sister     1 sister had miscarriage.    Allergies  Allergen Reactions  . Shellfish Allergy Itching    Tongue goes numb.  . Morphine And Related Itching    Severe itching  . Percocet [Oxycodone-Acetaminophen] Nausea And Vomiting    Severe n/v  . Stadol [Butorphanol] Other (See Comments)    hallucinations  . Latex Rash  . Sulfa Antibiotics Rash    Current Outpatient Prescriptions on File Prior to Visit  Medication Sig Dispense Refill  . aspirin 325 MG tablet Take 325 mg by mouth daily.      No current facility-administered medications on file prior to visit.     BP 132/86   Pulse 70   Temp 98.1 F (36.7 C) (Oral)   Ht 5' 4.5" (1.638 m)   Wt 136 lb (61.7 kg)   SpO2 99%   BMI 22.98 kg/m    Objective:   Physical Exam  Constitutional: Julie Johns is oriented to person, place, and time. Julie Johns appears well-nourished.  HENT:  Right Ear: Tympanic membrane and ear canal normal.  Left Ear: Tympanic membrane and ear canal normal.  Nose: Nose normal.  Mouth/Throat: Oropharynx is clear and moist.  Eyes: Conjunctivae and EOM are normal. Pupils are equal, round, and reactive to light.  Neck: Neck supple. No thyromegaly present.  Cardiovascular: Normal rate and regular rhythm.   No murmur heard. Pulmonary/Chest: Effort normal and breath sounds normal. Julie Johns has no rales.  Abdominal: Soft. Bowel sounds are normal. There is no tenderness.  Musculoskeletal: Normal range of motion.  Lymphadenopathy:    Julie Johns has no cervical adenopathy.  Neurological: Julie Johns is alert and oriented to person, place, and time. Julie Johns has normal reflexes. No cranial nerve deficit.  Skin: Skin is warm and dry. No rash noted.  Psychiatric: Julie Johns has a normal mood and affect.          Assessment & Plan:

## 2016-09-10 NOTE — Assessment & Plan Note (Signed)
Td UTD. Influenza vaccination provided today. Mammogram UTD. Colonoscopy ordered. Commended her on her healthy lifestyle, recommended regular exercise. Exam unremarkable. Labs pending. Follow up in 1 year for annual exam.

## 2016-09-11 LAB — COMPREHENSIVE METABOLIC PANEL
ALT: 12 U/L (ref 0–35)
AST: 13 U/L (ref 0–37)
Albumin: 4 g/dL (ref 3.5–5.2)
Alkaline Phosphatase: 41 U/L (ref 39–117)
BUN: 10 mg/dL (ref 6–23)
CO2: 31 mEq/L (ref 19–32)
Calcium: 9.3 mg/dL (ref 8.4–10.5)
Chloride: 103 mEq/L (ref 96–112)
Creatinine, Ser: 0.68 mg/dL (ref 0.40–1.20)
GFR: 118.78 mL/min (ref >60.00)
Glucose, Bld: 84 mg/dL (ref 70–99)
Potassium: 4.5 mEq/L (ref 3.5–5.1)
Sodium: 138 mEq/L (ref 135–145)
Total Bilirubin: 0.3 mg/dL (ref 0.2–1.2)
Total Protein: 7.2 g/dL (ref 6.0–8.3)

## 2016-09-11 LAB — LIPID PANEL
Cholesterol: 169 mg/dL (ref 0–200)
HDL: 59.3 mg/dL (ref >39.00)
LDL Cholesterol: 96 mg/dL (ref 0–99)
NonHDL: 109.44
Total CHOL/HDL Ratio: 3
Triglycerides: 69 mg/dL (ref 0.0–149.0)
VLDL: 13.8 mg/dL (ref 0.0–40.0)

## 2016-09-11 LAB — VITAMIN D 25 HYDROXY (VIT D DEFICIENCY, FRACTURES): VITD: 10.6 ng/mL — ABNORMAL LOW (ref 30.00–100.00)

## 2016-09-12 ENCOUNTER — Encounter: Payer: Self-pay | Admitting: Gastroenterology

## 2016-09-12 ENCOUNTER — Other Ambulatory Visit: Payer: Self-pay | Admitting: Primary Care

## 2016-09-12 DIAGNOSIS — E559 Vitamin D deficiency, unspecified: Secondary | ICD-10-CM

## 2016-09-12 MED ORDER — VITAMIN D (ERGOCALCIFEROL) 1.25 MG (50000 UNIT) PO CAPS: ORAL_CAPSULE | ORAL | 0 refills | Status: DC

## 2016-09-14 ENCOUNTER — Encounter: Payer: Self-pay | Admitting: Primary Care

## 2016-09-16 ENCOUNTER — Other Ambulatory Visit: Payer: Self-pay | Admitting: Primary Care

## 2016-09-16 ENCOUNTER — Encounter: Payer: Self-pay | Admitting: Primary Care

## 2016-10-14 ENCOUNTER — Ambulatory Visit (INDEPENDENT_AMBULATORY_CARE_PROVIDER_SITE_OTHER): Payer: BLUE CROSS/BLUE SHIELD | Admitting: Gastroenterology

## 2016-10-14 VITALS — BP 112/50 | HR 72 | Ht 64.5 in | Wt 135.0 lb

## 2016-10-14 DIAGNOSIS — K625 Hemorrhage of anus and rectum: Secondary | ICD-10-CM | POA: Diagnosis not present

## 2016-10-14 MED ORDER — NA SULFATE-K SULFATE-MG SULF 17.5-3.13-1.6 GM/177ML PO SOLN: 1.0000 | Freq: Once | ORAL | 0 refills | Status: AC

## 2016-10-14 NOTE — Patient Instructions (Signed)
If you are age 48 or older, your body mass index should be between 23-30. Your Body mass index is 22.81 kg/m. If this is out of the aforementioned range listed, please consider follow up with your Primary Care Provider.  If you are age 16 or younger, your body mass index should be between 19-25. Your Body mass index is 22.81 kg/m. If this is out of the aformentioned range listed, please consider follow up with your Primary Care Provider.   You have been scheduled for a colonoscopy. Please follow written instructions given to you at your visit today.  Please pick up your prep supplies at the pharmacy within the next 1-3 days. If you use inhalers (even only as needed), please bring them with you on the day of your procedure. Your physician has requested that you go to www.startemmi.com and enter the access code given to you at your visit today. This web site gives a general overview about your procedure. However, you should still follow specific instructions given to you by our office regarding your preparation for the procedure.  Thank you for choosing [facility]  Dr Wilfrid Lund III

## 2016-10-14 NOTE — Progress Notes (Addendum)
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 She has small volume painless intermittent bleeding for the last couple of years. She was concerned about a family history of colon cancer in a cousin and uncle. No parents or siblings of hers have had colon or rectal cancer. She denies abdominal pain altered bowel habits, dysphagia, nausea, vomiting, early satiety or weight loss.   ROS:  Review of Systems  Constitutional: Negative for appetite change and unexpected weight change.  HENT: Negative for mouth sores and voice change.   Eyes: Negative for pain and redness.  Respiratory: Negative for cough and shortness of breath.   Cardiovascular: Negative for chest pain and palpitations.  Genitourinary: Negative for dysuria and hematuria.  Musculoskeletal: Negative for arthralgias and myalgias.  Skin: Negative for pallor and rash.  Neurological: Negative for weakness and headaches.  Hematological: Negative for adenopathy.     Past Medical History: Past Medical History:  Diagnosis Date  . Anemia    after the TAH Not now  . PE (pulmonary thromboembolism) ([place])   . Thrombophlebitis - postoperative   . Uterine fibroid   The PE occurred about 2 weeks after hysterectomy, and a DVT 2 years later happened after a long plane trip from the [place]   Past Surgical History: Past Surgical History:  Procedure Laterality Date  . ABDOMINAL HYSTERECTOMY    . CESAREAN SECTION  2001, 2005  . LASIK    . MOUTH SURGERY    . MYOMECTOMY  2000  . NOVASURE ABLATION    . TUBAL LIGATION  2005  . TUBAL LIGATION       Family History: Family History  Problem Relation  Age of Onset  . Hypertension Mother   . Prostate cancer Father     died from alzhmier's disease  . Hypertension Father   . Alzheimer's disease Father   . Miscarriages / Stillbirths Sister     1 sister had miscarriage.  . Colon cancer Other     x 2 cousins (other cousin died from colon cancer)  . Stomach cancer Neg Hx   . Rectal cancer Neg Hx   . Esophageal cancer Neg Hx   . Liver cancer Neg Hx     Social History: Social History   Social History  . Marital status: Married    Spouse name: N/A  . Number of children: 2  . Years of education: College   Occupational History  .  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 Smoking status: Never Smoker  . Smokeless tobacco: Never Used  . Alcohol use 0.0 oz/week     Comment: occasional  . Drug use: No  . Sexual activity: Yes    Partners: Male    Birth control/ protection: Surgical   Other Topics Concern  . None   Social History Narrative   Married.   2 children.   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 She has two boys ages 68 and 8. She is physically active. She recently travelled to [place] for 8 days and had good time there.  Enjoys photography, Oncologist books.   She has left clinical practice and now works for [employer]: Allergies  Allergen Reactions  . Shellfish Allergy Itching    Tongue goes numb.  . Morphine And Related Itching    Severe itching  . Percocet [Oxycodone-Acetaminophen] Nausea And Vomiting    Severe n/v  . Stadol [Butorphanol] Other (See Comments)    hallucinations  . Latex Rash  . Sulfa Antibiotics Rash    Outpatient Meds: Current Outpatient Prescriptions  Medication Sig Dispense Refill  . aspirin 325 MG tablet Take 325 mg by mouth daily.    . Vitamin D, Ergocalciferol, (DRISDOL) 50000 units CAPS capsule Take 1 capsule by mouth once weekly  for 12 weeks. 12 capsule 0  . Na Sulfate-K Sulfate-Mg Sulf 17.5-3.13-1.6 GM/180ML SOLN Take 1 kit by mouth once. 354 mL 0   No current facility-administered medications for this visit.       ___________________________________________________________________ Objective   Exam:  BP (!) 112/50   Pulse 72   Ht 5' 4.5" (1.638 m)   Wt 135 lb (61.2 kg)   BMI 22.81 kg/m    General: this is [name] looks younger than stated age, good muscle mass   Eyes: sclera anicteric, no redness  ENT: oral mucosa moist without lesions, no cervical or supraclavicular lymphadenopathy, good dentition  CV: RRR without murmur, S1/S2, no JVD, no peripheral edema  Resp: clear to auscultation bilaterally, normal RR and effort noted  GI: soft, no tenderness, with active bowel sounds. No guarding or palpable organomegaly noted.  Skin; warm and dry, no rash or jaundice noted  Neuro: awake, alert and oriented x 3. Normal gross motor function and fluent speech Rectal exam deferred Labs:  CBC Latest Ref Rng & Units 11/17/2014 07/28/2014 06/08/2012  WBC 3.9 - 10.3 10e3/uL 5.3 6.5 7.9  Hemoglobin 11.6 - 15.9 g/dL 12.6 12.6 9.8(L)  Hematocrit 34.8 - 46.6 % 37.3 37.1 29.2(L)  Platelets 145 - 400 10e3/uL 237 277 346   Normal CMP on 09/10/2016  Assessment: Encounter Diagnosis  Name Primary?  . Rectal bleeding Yes    It sounds likely to be benign bleeding, but she had iron agreement that a colonoscopy is warranted to rule out neoplasia. While she understands that her family history of colon cancer still puts her in the average risk category, she is understandably concerned about these and other acquaintances who have had colorectal cancer diagnosed at a relatively young age.  Plan:  Colonoscopy she is agreeable  The benefits and risks of the planned procedure were described in detail with the patient or (when appropriate) their health care proxy.  Risks were outlined as including, but  not limited to, bleeding, infection, perforation, adverse medication reaction leading to cardiac or pulmonary decompensation, or pancreatitis (if ERCP).  The limitation of incomplete mucosal visualization was also discussed.  No guarantees or warranties were given.   Thank you for the courtesy of this consult.  Please call me with any questions or concerns.  Nelida Meuse III  CC: Sheral Flow, NP

## 2016-10-24 ENCOUNTER — Encounter: Payer: Self-pay | Admitting: Gastroenterology

## 2016-11-06 ENCOUNTER — Encounter: Payer: Self-pay | Admitting: Gastroenterology

## 2016-11-06 ENCOUNTER — Ambulatory Visit (AMBULATORY_SURGERY_CENTER): Payer: BLUE CROSS/BLUE SHIELD | Admitting: Gastroenterology

## 2016-11-06 VITALS — BP 129/70 | HR 68 | Temp 98.7°F | Resp 11 | Ht 64.5 in | Wt 135.0 lb

## 2016-11-06 DIAGNOSIS — K625 Hemorrhage of anus and rectum: Secondary | ICD-10-CM

## 2016-11-06 MED ORDER — SODIUM CHLORIDE 0.9 % IV SOLN: 500.0000 mL | INTRAVENOUS | Status: AC

## 2016-11-06 NOTE — Op Note (Signed)
[facility] Patient Name: Julie Johns Procedure Date: 11/06/2016 3:36 PM MRN: [id number] Endoscopist: Mallie Mussel L. Loletha Carrow , MD Age: 46 Referring MD:  Date of Birth: 1970-12-22 Gender: Female Account #: [id number] Procedure:                Colonoscopy Indications:              Rectal bleeding Medicines:                Monitored Anesthesia Care Procedure:                Pre-Anesthesia Assessment:                           - Prior to the procedure, a History and Physical                            was performed, and patient medications and                            allergies were reviewed. The patient's tolerance of                            previous anesthesia was also reviewed. The risks                            and benefits of the procedure and the sedation                            options and risks were discussed with the patient.                            All questions were answered, and informed consent                            was obtained. Anticoagulants: The patient has taken                            aspirin. It was decided not to withhold this                            medication prior to the procedure. ASA Grade                            Assessment: II - A patient with mild systemic                            disease. After reviewing the risks and benefits,                            the patient was deemed in satisfactory condition to                            undergo the procedure.  After obtaining informed consent, the colonoscope                            was passed under direct vision. Throughout the                            procedure, the patient's blood pressure, pulse, and                            oxygen saturations were monitored continuously. The                            Colonoscope was introduced through the anus and                            advanced to the the cecum, identified by   appendiceal orifice and ileocecal valve. The                            colonoscopy was performed with difficulty due to a                            tortuous colon. Successful completion of the                            procedure was aided by using manual pressure. The                            patient tolerated the procedure well. The quality                            of the bowel preparation was excellent. The                            ileocecal valve, appendiceal orifice, and rectum                            were photographed. The quality of the bowel                            preparation was evaluated using the BBPS [facility]                            Bowel Preparation Scale) with scores of: Right                            Colon = 3, Transverse Colon = 3 and Left Colon = 3                            (entire mucosa seen well with no residual staining,                            small fragments of stool or opaque liquid). The  total BBPS score equals 9. The bowel preparation                            used was SUPREP. Scope In: 3:52:17 PM Scope Out: 4:07:33 PM Scope Withdrawal Time: 0 hours 8 minutes 30 seconds  Total Procedure Duration: 0 hours 15 minutes 16 seconds  Findings:                 The perianal and digital rectal examinations were                            normal.                           The entire examined colon appeared normal on direct                            and retroflexion views. Complications:            No immediate complications. Estimated Blood Loss:     Estimated blood loss: none. Impression:               - The entire examined colon is normal on direct and                            retroflexion views.                           - No specimens collected.                           Benign, intermittent anal bleeding. Recommendation:           - Patient has a contact number available for                            emergencies.  The signs and symptoms of potential                            delayed complications were discussed with the                            patient. Return to normal activities tomorrow.                            Written discharge instructions were provided to the                            patient.                           - Resume previous diet.                           - Continue present medications.                           - Repeat colonoscopy in 10 years for screening  purposes. Samary Shatz L. Loletha Carrow, MD 11/06/2016 4:12:39 PM This report has been signed electronically.

## 2016-11-06 NOTE — Progress Notes (Signed)
Patient in recovery post colonoscopy, abdomen initially soft upon arrival, patient not passing flatus, at 10 minutes patient's abdomen is slightly firm, still not passing flatus, patient denies abdominal pain, but states she can "feel some gas bubbles".  Administered Levsin .25mg  po and repositioned patient left to right to trendelenburg without success. Assisted patient to ambulate around nurses' station X2 and to bathroom to sit on commode without success. Assisted patient back to stretcher on all 4s with buttocks up in the air. Patient now able to pass flatus, abdomen softer and patient reports relief.

## 2016-11-06 NOTE — Progress Notes (Signed)
To recovery, report to Hines, RN, VSS 

## 2016-11-06 NOTE — Patient Instructions (Signed)
YOU HAD AN ENDOSCOPIC PROCEDURE TODAY AT THE [facility]:   Refer to the procedure report that was given to you for any specific questions about what was found during the examination.  If the procedure report does not answer your questions, please call your gastroenterologist to clarify.  If you requested that your care partner not be given the details of your procedure findings, then the procedure report has been included in a sealed envelope for you to review at your convenience later.  YOU SHOULD EXPECT: Some feelings of bloating in the abdomen. Passage of more gas than usual.  Walking can help get rid of the air that was put into your GI tract during the procedure and reduce the bloating. If you had a lower endoscopy (such as a colonoscopy or flexible sigmoidoscopy) you may notice spotting of blood in your stool or on the toilet paper. If you underwent a bowel prep for your procedure, you may not have a normal bowel movement for a few days.  Please Note:  You might notice some irritation and congestion in your nose or some drainage.  This is from the oxygen used during your procedure.  There is no need for concern and it should clear up in a day or so.  SYMPTOMS TO REPORT IMMEDIATELY:   Following lower endoscopy (colonoscopy or flexible sigmoidoscopy):  Excessive amounts of blood in the stool  Significant tenderness or worsening of abdominal pains  Swelling of the abdomen that is new, acute  Fever of 100F or higher  For urgent or emergent issues, a gastroenterologist can be reached at any hour by calling [phone].   DIET:  We do recommend a small meal at first, but then you may proceed to your regular diet.  Drink plenty of fluids but you should avoid alcoholic beverages for 24 hours.  MEDICATIONS:  Continue present medications.  ACTIVITY:  You should plan to take it easy for the rest of today and you should NOT DRIVE or use heavy machinery until tomorrow (because of the  sedation medicines used during the test).    FOLLOW UP: Our staff will call the number listed on your records the next business day following your procedure to check on you and address any questions or concerns that you may have regarding the information given to you following your procedure. If we do not reach you, we will leave a message.  However, if you are feeling well and you are not experiencing any problems, there is no need to return our call.  We will assume that you have returned to your regular daily activities without incident.  If any biopsies were taken you will be contacted by phone or by letter within the next 1-3 weeks.  Please call us at [phone] if you have not heard about the biopsies in 3 weeks.   Thank you for allowing us to provide for your healthcare needs today.   SIGNATURES/CONFIDENTIALITY: You and/or your care partner have signed paperwork which will be entered into your electronic medical record.  These signatures attest to the fact that that the information above on your After Visit Summary has been reviewed and is understood.  Full responsibility of the confidentiality of this discharge information lies with you and/or your care-partner. 

## 2016-11-07 ENCOUNTER — Telehealth: Payer: Self-pay

## 2016-11-07 NOTE — Telephone Encounter (Signed)
  Follow up Call-  Call back number 11/06/2016  Post procedure Call Back phone  # [phone]  Permission to leave phone message Yes  Some recent data might be hidden     Patient questions:  Do you have a fever, pain , or abdominal swelling? No. Pain Score  0 *  Have you tolerated food without any problems? Yes.    Have you been able to return to your normal activities? Yes.    Do you have any questions about your discharge instructions: Diet   No. Medications  No. Follow up visit  No.  Do you have questions or concerns about your Care? No.  Actions: * If pain score is 4 or above: No action needed, pain <4.

## 2016-12-12 ENCOUNTER — Other Ambulatory Visit: Payer: Self-pay | Admitting: Primary Care

## 2016-12-12 DIAGNOSIS — E559 Vitamin D deficiency, unspecified: Secondary | ICD-10-CM

## 2016-12-12 NOTE — Telephone Encounter (Signed)
Message left for patient to return my call.  

## 2016-12-12 NOTE — Telephone Encounter (Signed)
Ok to refill? Electronically refill request for Vitamin D, Ergocalciferol, (DRISDOL) 50000 units CAPS capsule. Last prescribed on 09/12/2016. Last seen on 09/10/2016. Patient suppose to call back and schedule lab appt.

## 2016-12-16 NOTE — Telephone Encounter (Signed)
Spoke to pt and scheduled lab for 4/11. Pt advised she will be contacted with results and if she is needing to continue VitD

## 2016-12-18 ENCOUNTER — Other Ambulatory Visit (INDEPENDENT_AMBULATORY_CARE_PROVIDER_SITE_OTHER): Payer: BLUE CROSS/BLUE SHIELD

## 2016-12-18 DIAGNOSIS — E559 Vitamin D deficiency, unspecified: Secondary | ICD-10-CM

## 2016-12-18 LAB — VITAMIN D 25 HYDROXY (VIT D DEFICIENCY, FRACTURES): VITD: 40.24 ng/mL (ref 30.00–100.00)

## 2017-05-06 ENCOUNTER — Other Ambulatory Visit: Payer: Self-pay | Admitting: Obstetrics and Gynecology

## 2017-05-06 DIAGNOSIS — Z1231 Encounter for screening mammogram for malignant neoplasm of breast: Secondary | ICD-10-CM

## 2017-05-14 ENCOUNTER — Ambulatory Visit
Admission: RE | Admit: 2017-05-14 | Discharge: 2017-05-14 | Disposition: A | Discharge status: Home / Self Care | Payer: BLUE CROSS/BLUE SHIELD | Source: Ambulatory Visit | Attending: Obstetrics and Gynecology | Admitting: Obstetrics and Gynecology

## 2017-05-14 DIAGNOSIS — Z1231 Encounter for screening mammogram for malignant neoplasm of breast: Secondary | ICD-10-CM

## 2017-08-12 ENCOUNTER — Telehealth: Payer: BLUE CROSS/BLUE SHIELD | Admitting: Family

## 2017-08-12 DIAGNOSIS — N3000 Acute cystitis without hematuria: Secondary | ICD-10-CM

## 2017-08-12 MED ORDER — NITROFURANTOIN MONOHYD MACRO 100 MG PO CAPS: 100.0000 mg | ORAL_CAPSULE | Freq: Two times a day (BID) | ORAL | 0 refills | Status: DC

## 2017-08-12 NOTE — Progress Notes (Signed)

## 2017-09-16 DIAGNOSIS — Z01419 Encounter for gynecological examination (general) (routine) without abnormal findings: Secondary | ICD-10-CM | POA: Diagnosis not present

## 2017-09-16 DIAGNOSIS — B373 Candidiasis of vulva and vagina: Secondary | ICD-10-CM | POA: Diagnosis not present

## 2017-09-16 DIAGNOSIS — N390 Urinary tract infection, site not specified: Secondary | ICD-10-CM | POA: Diagnosis not present

## 2017-09-16 DIAGNOSIS — R39198 Other difficulties with micturition: Secondary | ICD-10-CM | POA: Diagnosis not present

## 2017-09-16 DIAGNOSIS — Z6824 Body mass index (BMI) 24.0-24.9, adult: Secondary | ICD-10-CM | POA: Diagnosis not present

## 2018-01-29 DIAGNOSIS — I868 Varicose veins of other specified sites: Secondary | ICD-10-CM | POA: Diagnosis not present

## 2018-06-24 ENCOUNTER — Other Ambulatory Visit: Payer: Self-pay | Admitting: Obstetrics and Gynecology

## 2018-06-24 DIAGNOSIS — Z1231 Encounter for screening mammogram for malignant neoplasm of breast: Secondary | ICD-10-CM

## 2018-07-29 ENCOUNTER — Ambulatory Visit
Admission: RE | Admit: 2018-07-29 | Discharge: 2018-07-29 | Disposition: A | Discharge status: Home / Self Care | Payer: BLUE CROSS/BLUE SHIELD | Source: Ambulatory Visit | Attending: Obstetrics and Gynecology | Admitting: Obstetrics and Gynecology

## 2018-07-29 DIAGNOSIS — Z1231 Encounter for screening mammogram for malignant neoplasm of breast: Secondary | ICD-10-CM

## 2019-06-01 DIAGNOSIS — L910 Hypertrophic scar: Secondary | ICD-10-CM | POA: Diagnosis not present

## 2019-06-01 DIAGNOSIS — L723 Sebaceous cyst: Secondary | ICD-10-CM | POA: Diagnosis not present

## 2019-06-01 DIAGNOSIS — Z23 Encounter for immunization: Secondary | ICD-10-CM | POA: Diagnosis not present

## 2019-06-28 DIAGNOSIS — Z20828 Contact with and (suspected) exposure to other viral communicable diseases: Secondary | ICD-10-CM | POA: Diagnosis not present

## 2019-06-29 ENCOUNTER — Other Ambulatory Visit: Payer: Self-pay | Admitting: Obstetrics and Gynecology

## 2019-06-29 DIAGNOSIS — Z1231 Encounter for screening mammogram for malignant neoplasm of breast: Secondary | ICD-10-CM

## 2019-07-12 DIAGNOSIS — Z20828 Contact with and (suspected) exposure to other viral communicable diseases: Secondary | ICD-10-CM | POA: Diagnosis not present

## 2019-07-15 DIAGNOSIS — Z20828 Contact with and (suspected) exposure to other viral communicable diseases: Secondary | ICD-10-CM | POA: Diagnosis not present

## 2019-07-24 DIAGNOSIS — J329 Chronic sinusitis, unspecified: Secondary | ICD-10-CM | POA: Diagnosis not present

## 2019-07-24 DIAGNOSIS — Z20828 Contact with and (suspected) exposure to other viral communicable diseases: Secondary | ICD-10-CM | POA: Diagnosis not present

## 2019-08-18 ENCOUNTER — Other Ambulatory Visit: Payer: Self-pay

## 2019-08-18 ENCOUNTER — Ambulatory Visit
Admission: RE | Admit: 2019-08-18 | Discharge: 2019-08-18 | Disposition: A | Discharge status: Home / Self Care | Payer: BC Managed Care – PPO | Source: Ambulatory Visit | Attending: Obstetrics and Gynecology | Admitting: Obstetrics and Gynecology

## 2019-08-18 DIAGNOSIS — Z1231 Encounter for screening mammogram for malignant neoplasm of breast: Secondary | ICD-10-CM | POA: Diagnosis not present

## 2019-08-24 DIAGNOSIS — Z03818 Encounter for observation for suspected exposure to other biological agents ruled out: Secondary | ICD-10-CM | POA: Diagnosis not present

## 2019-09-16 DIAGNOSIS — L910 Hypertrophic scar: Secondary | ICD-10-CM | POA: Diagnosis not present

## 2019-10-27 DIAGNOSIS — L910 Hypertrophic scar: Secondary | ICD-10-CM | POA: Diagnosis not present

## 2020-03-28 DIAGNOSIS — L299 Pruritus, unspecified: Secondary | ICD-10-CM | POA: Diagnosis not present

## 2020-03-28 DIAGNOSIS — L910 Hypertrophic scar: Secondary | ICD-10-CM | POA: Diagnosis not present

## 2020-04-26 DIAGNOSIS — L299 Pruritus, unspecified: Secondary | ICD-10-CM | POA: Diagnosis not present

## 2020-04-26 DIAGNOSIS — L910 Hypertrophic scar: Secondary | ICD-10-CM | POA: Diagnosis not present

## 2020-06-06 DIAGNOSIS — L298 Other pruritus: Secondary | ICD-10-CM | POA: Diagnosis not present

## 2020-06-06 DIAGNOSIS — L910 Hypertrophic scar: Secondary | ICD-10-CM | POA: Diagnosis not present

## 2020-06-07 DIAGNOSIS — N951 Menopausal and female climacteric states: Secondary | ICD-10-CM | POA: Diagnosis not present

## 2020-06-07 DIAGNOSIS — R6882 Decreased libido: Secondary | ICD-10-CM | POA: Diagnosis not present

## 2020-06-07 DIAGNOSIS — Z01419 Encounter for gynecological examination (general) (routine) without abnormal findings: Secondary | ICD-10-CM | POA: Diagnosis not present

## 2020-06-07 DIAGNOSIS — Z90710 Acquired absence of both cervix and uterus: Secondary | ICD-10-CM | POA: Diagnosis not present

## 2020-06-07 DIAGNOSIS — Z13220 Encounter for screening for lipoid disorders: Secondary | ICD-10-CM | POA: Diagnosis not present

## 2020-07-05 DIAGNOSIS — L910 Hypertrophic scar: Secondary | ICD-10-CM | POA: Diagnosis not present

## 2020-07-05 DIAGNOSIS — L298 Other pruritus: Secondary | ICD-10-CM | POA: Diagnosis not present

## 2020-07-24 ENCOUNTER — Other Ambulatory Visit: Payer: Self-pay | Admitting: Obstetrics and Gynecology

## 2020-07-24 DIAGNOSIS — Z1231 Encounter for screening mammogram for malignant neoplasm of breast: Secondary | ICD-10-CM

## 2020-09-05 ENCOUNTER — Ambulatory Visit
Admission: RE | Admit: 2020-09-05 | Discharge: 2020-09-05 | Disposition: A | Discharge status: Home / Self Care | Payer: BC Managed Care – PPO | Source: Ambulatory Visit | Attending: Obstetrics and Gynecology | Admitting: Obstetrics and Gynecology

## 2020-09-05 ENCOUNTER — Other Ambulatory Visit: Payer: Self-pay

## 2020-09-05 DIAGNOSIS — Z1231 Encounter for screening mammogram for malignant neoplasm of breast: Secondary | ICD-10-CM

## 2020-10-04 ENCOUNTER — Telehealth: Payer: Self-pay

## 2020-10-04 NOTE — Telephone Encounter (Signed)
Patient called stating she wanted to get in to be seen for wax impaction, ear pain. Patient has not been seen since 09/10/2016. Advised patient we are not taking any new patients but since she was seen here before I will check with Anda Kraft to see if she can get established. If she can be seen again, can patient see anyone for acute visit? Right now nothing is open until Friday and I do not know if she should wait that long?  Please call patient back as soon as able to at [phone]

## 2020-10-04 NOTE — Telephone Encounter (Signed)
I am happy to accept patient back, we have an opening on Friday 01/28, please schedule.

## 2020-10-05 NOTE — Telephone Encounter (Signed)
App has been made no further actions needed.

## 2020-10-06 ENCOUNTER — Encounter: Payer: Self-pay | Admitting: Primary Care

## 2020-10-06 ENCOUNTER — Ambulatory Visit (INDEPENDENT_AMBULATORY_CARE_PROVIDER_SITE_OTHER): Payer: BC Managed Care – PPO | Admitting: Primary Care

## 2020-10-06 ENCOUNTER — Other Ambulatory Visit: Payer: Self-pay

## 2020-10-06 DIAGNOSIS — H6120 Impacted cerumen, unspecified ear: Secondary | ICD-10-CM | POA: Insufficient documentation

## 2020-10-06 DIAGNOSIS — H6123 Impacted cerumen, bilateral: Secondary | ICD-10-CM

## 2020-10-06 NOTE — Assessment & Plan Note (Signed)
Noted bilaterally. She consents to irrigation. Bilateral canals irrigated. She tolerated well.  TM's post irrigation unremarkable.

## 2020-10-06 NOTE — Progress Notes (Signed)
Subjective:    Patient ID: Julie Johns, female    DOB: 02-Dec-1970, 50 y.o.   MRN: [id number]  HPI  This visit occurred during the SARS-CoV-2 public health emergency.  Safety protocols were in place, including screening questions prior to the visit, additional usage of staff PPE, and extensive cleaning of exam room while observing appropriate contact time as indicated for disinfecting solutions.   Julie Johns is a 50 year old female who presents today to re-establish care and to discuss the issues below.  1) Ear Fullness: Long history of cerumen impactions secondary to stethoscope use. Ear fullness with decreased hearing to right ear for months. She denies symptoms to left ear. She has noticed some pain to the right ear. She's not tried anything at home.  She follows with GYN for her annual wellness visits and endorses pap smear, mammogram are up to date. Colonoscopy is UTD, last completed in 2018, due in 2028.   BP Readings from Last 3 Encounters:  10/06/20 122/76  11/06/16 129/70  10/14/16 (!) 112/50     Review of Systems  Constitutional: Negative for fever.  HENT: Positive for ear pain and hearing loss. Negative for postnasal drip and sore throat.        Ear fullness  Respiratory: Negative for cough.   Neurological: Negative for headaches.       Past Medical History:  Diagnosis Date  . Anemia    after the TAH Not now  . PE (pulmonary thromboembolism) ([place])   . Thrombophlebitis - postoperative   . Uterine fibroid      Social History   Socioeconomic History  . Marital status: Married    Spouse name: Not on file  . Number of children: 2  . Years of education: College  . 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 Smoking status: Never Smoker  . Smokeless tobacco: Never Used  Substance and Sexual Activity  . Alcohol use: Yes    Alcohol/week: 0.0 standard  drinks    Comment: occasional  . Drug use: No  . Sexual activity: Yes    Partners: Male    Birth control/protection: Surgical  Other Topics Concern  . Not on file  Social History Narrative   Married.   2 children.   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 She has two boys ages 29 and 22. She is physically active. She recently travelled to [place] for 8 days and had good time there.   Enjoys photography, Oncologist books.   Social Determinants of Health   Financial Resource Strain: Not on file  Food Insecurity: Not on file  Transportation Needs: Not on file  Physical Activity: Not on file  Stress: Not on file  Social Connections: Not on file  Intimate Partner Violence: Not on file    Past Surgical History:  Procedure Laterality Date  . ABDOMINAL HYSTERECTOMY    . CESAREAN SECTION  2001, 2005  . LASIK    . MOUTH SURGERY    . MYOMECTOMY  2000  . NOVASURE ABLATION    . TUBAL LIGATION  2005  . TUBAL LIGATION      Family History  Problem Relation Age of Onset  . Hypertension Mother   . Prostate cancer Father  died from alzhmier's disease  . Hypertension Father   . Alzheimer's disease Father   . Miscarriages / Stillbirths Sister        1 sister had miscarriage.  . Colon cancer Other        x 2 cousins (other cousin died from colon cancer)  . Stomach cancer Neg Hx   . Rectal cancer Neg Hx   . Esophageal cancer Neg Hx   . Liver cancer Neg Hx     Allergies  Allergen Reactions  . Shellfish Allergy Itching    Tongue goes numb.  . Morphine And Related Itching    Severe itching  . Percocet [Oxycodone-Acetaminophen] Nausea And Vomiting    Severe n/v  . Stadol [Butorphanol] Other (See Comments)    hallucinations  . Latex Rash  . Sulfa Antibiotics Rash    Current Outpatient Medications on File Prior to Visit  Medication Sig Dispense Refill  . aspirin 325 MG  tablet Take 325 mg by mouth daily.    . nitrofurantoin, macrocrystal-monohydrate, (MACROBID) 100 MG capsule Take 1 capsule (100 mg total) by mouth 2 (two) times daily. (Patient not taking: Reported on 10/06/2020) 10 capsule 0  . Vitamin D, Ergocalciferol, (DRISDOL) 50000 units CAPS capsule Take 1 capsule by mouth once weekly for 12 weeks. (Patient not taking: Reported on 10/06/2020) 12 capsule 0   Current Facility-Administered Medications on File Prior to Visit  Medication Dose Route Frequency Provider Last Rate Last Admin  . 0.9 %  sodium chloride infusion  500 mL Intravenous Continuous Danis, Estill Cotta III, MD        BP 122/76   Pulse 67   Temp (!) 97.2 F (36.2 C) (Temporal)   Ht 5\' 5"  (1.651 m)   Wt 146 lb 6.4 oz (66.4 kg)   SpO2 98%   BMI 24.36 kg/m    Objective:   Physical Exam Constitutional:      Appearance: She is well-nourished.  HENT:     Ears:     Comments: Bilateral ears with cerumen impaction.  Cardiovascular:     Rate and Rhythm: Normal rate and regular rhythm.  Pulmonary:     Effort: Pulmonary effort is normal.     Breath sounds: Normal breath sounds.  Musculoskeletal:     Cervical back: Neck supple.  Skin:    General: Skin is warm and dry.  Psychiatric:        Mood and Affect: Mood and affect and mood normal.            Assessment & Plan:

## 2020-10-06 NOTE — Patient Instructions (Addendum)
Welcome back! It was a pleasure to see you today!   Earwax Buildup, Adult The ears produce a substance called earwax that helps keep bacteria out of the ear and protects the skin in the ear canal. Occasionally, earwax can build up in the ear and cause discomfort or hearing loss. What are the causes? This condition is caused by a buildup of earwax. Ear canals are self-cleaning. Ear wax is made in the outer part of the ear canal and generally falls out in small amounts over time. When the self-cleaning mechanism is not working, earwax builds up and can cause decreased hearing and discomfort. Attempting to clean ears with cotton swabs can push the earwax deep into the ear canal and cause decreased hearing and pain. What increases the risk? This condition is more likely to develop in people who:  Clean their ears often with cotton swabs.  Pick at their ears.  Use earplugs or in-ear headphones often, or wear hearing aids. The following factors may also make you more likely to develop this condition:  Being female.  Being of older age.  Naturally producing more earwax.  Having narrow ear canals.  Having earwax that is overly thick or sticky.  Having excess hair in the ear canal.  Having eczema.  Being dehydrated. What are the signs or symptoms? Symptoms of this condition include:  Reduced or muffled hearing.  A feeling of fullness in the ear or feeling that the ear is plugged.  Fluid coming from the ear.  Ear pain or an itchy ear.  Ringing in the ear.  Coughing.  Balance problems.  An obvious piece of earwax that can be seen inside the ear canal. How is this diagnosed? This condition may be diagnosed based on:  Your symptoms.  Your medical history.  An ear exam. During the exam, your health care provider will look into your ear with an instrument called an otoscope. You may have tests, including a hearing test. How is this treated? This condition may be treated  by:  Using ear drops to soften the earwax.  Having the earwax removed by a health care provider. The health care provider may: ? Flush the ear with water. ? Use an instrument that has a loop on the end (curette). ? Use a suction device.  Having surgery to remove the wax buildup. This may be done in severe cases. Follow these instructions at home:  Take over-the-counter and prescription medicines only as told by your health care provider.  Do not put any objects, including cotton swabs, into your ear. You can clean the opening of your ear canal with a washcloth or facial tissue.  Follow instructions from your health care provider about cleaning your ears. Do not overclean your ears.  Drink enough fluid to keep your urine pale yellow. This will help to thin the earwax.  Keep all follow-up visits as told. If earwax builds up in your ears often or if you use hearing aids, consider seeing your health care provider for routine, preventive ear cleanings. Ask your health care provider how often you should schedule your cleanings.  If you have hearing aids, clean them according to instructions from the manufacturer and your health care provider.   Contact a health care provider if:  You have ear pain.  You develop a fever.  You have pus or other fluid coming from your ear.  You have hearing loss.  You have ringing in your ears that does not go away.  You  feel like the room is spinning (vertigo).  Your symptoms do not improve with treatment. Get help right away if:  You have bleeding from the affected ear.  You have severe ear pain. Summary  Earwax can build up in the ear and cause discomfort or hearing loss.  The most common symptoms of this condition include reduced or muffled hearing, a feeling of fullness in the ear, or feeling that the ear is plugged.  This condition may be diagnosed based on your symptoms, your medical history, and an ear exam.  This condition may be  treated by using ear drops to soften the earwax or by having the earwax removed by a health care provider.  Do not put any objects, including cotton swabs, into your ear. You can clean the opening of your ear canal with a washcloth or facial tissue. This information is not intended to replace advice given to you by your health care provider. Make sure you discuss any questions you have with your health care provider. Document Revised: 12/14/2019 Document Reviewed: 12/14/2019 Elsevier Patient Education  [place].

## 2020-11-29 DIAGNOSIS — L298 Other pruritus: Secondary | ICD-10-CM | POA: Diagnosis not present

## 2020-11-29 DIAGNOSIS — R52 Pain, unspecified: Secondary | ICD-10-CM | POA: Diagnosis not present

## 2020-11-29 DIAGNOSIS — L910 Hypertrophic scar: Secondary | ICD-10-CM | POA: Diagnosis not present

## 2021-01-03 DIAGNOSIS — L910 Hypertrophic scar: Secondary | ICD-10-CM | POA: Diagnosis not present

## 2021-01-03 DIAGNOSIS — L298 Other pruritus: Secondary | ICD-10-CM | POA: Diagnosis not present

## 2021-01-03 DIAGNOSIS — L700 Acne vulgaris: Secondary | ICD-10-CM | POA: Diagnosis not present

## 2021-01-03 DIAGNOSIS — Z79899 Other long term (current) drug therapy: Secondary | ICD-10-CM | POA: Diagnosis not present

## 2021-02-08 DIAGNOSIS — Z20822 Contact with and (suspected) exposure to covid-19: Secondary | ICD-10-CM | POA: Diagnosis not present

## 2021-02-12 NOTE — Telephone Encounter (Signed)
LVM. Apts are available with Dr. Nani Ravens or Dr. Anitra Lauth today. Must explain to pt that treat is usually given within 5 days of symptom onset or diagnosis and it is has been 6 days since her symptoms started and 5 since she was diagnosed. Not sure if the providers will prescribe anything but she will have to have a VV to find that out.

## 2021-02-12 NOTE — Telephone Encounter (Signed)
[place] Night - Client TELEPHONE ADVICE RECORD [facility] Patient Name: Julie Johns Gender: Female DOB: 11/09/1970 Age: 50 Y 56 M 19 D Return Phone Number: [phone] (Primary), [phone] (Secondary) Address: City/ State/ Zip: [place]  [address] Client [facility] Night - Client Client Site [place] Physician Alma Friendly - NP Contact Type Call Who Is Calling Patient / Member / Family / Caregiver Call Type Triage / Clinical Relationship To Patient Self Return Phone Number [phone] (Primary) Chief Complaint Headache Reason for Call Symptomatic / Request for [place] states has cough, headache, congestion; Wed sx began; 2 home tests were neg; PCR was pos from [facility] test on Thurs; got results 2am today; Wants meds; Translation No Nurse Assessment Nurse: Self, RN, Heather Date/Time (Eastern Time): 02/10/2021 8:28:00 AM Confirm and document reason for call. If symptomatic, describe symptoms. ---Caller says Covid Positive , cough . S/S started Wednesday. Does the patient have any new or worsening symptoms? ---Yes Will a triage be completed? ---Yes Related visit to physician within the last 2 weeks? ---No Does the PT have any chronic conditions? (i.e. diabetes, asthma, this includes High risk factors for pregnancy, etc.) ---No Is the patient pregnant or possibly pregnant? (Ask all females between the ages of 72-55) ---No Is this a behavioral health or substance abuse call? ---No Guidelines Guideline Title Affirmed Question Affirmed Notes Nurse Date/Time ([place] Time) COVID-19 - Diagnosed or Suspected [1] Continuous (nonstop) coughing interferes with work or school AND [2] no improvement using cough treatment per Care Advice Self, RN, Nira Conn 02/10/2021 8:29:00 AM Disp. Time Eilene Ghazi Time) Disposition Final User PLEASE NOTE: All  timestamps contained within this report are represented as [place] Standard Time. CONFIDENTIALTY NOTICE: This fax transmission is intended only for the addressee. It contains information that is legally privileged, confidential or otherwise protected from use or disclosure. If you are not the intended recipient, you are strictly prohibited from reviewing, disclosing, copying using or disseminating any of this information or taking any action in reliance on or regarding this information. If you have received this fax in error, please notify us immediately by telephone so that we can arrange for its return to [place]. Phone: [phone], Toll-Free: [phone], Fax: [phone] Page: 2 of 2 Call Id: [id number] 02/10/2021 8:33:59 AM Call PCP within 24 Hours Yes Self, RN, Soyla Murphy Disagree/Comply Comply Caller Understands Yes PreDisposition Call Doctor Care Advice Given Per Guideline CALL PCP WITHIN 24 HOURS: * IF OFFICE WILL BE CLOSED: I'll page the on-call provider now. EXCEPTION: from 9 pm to 9 am. Since this isn't urgent, we'll hold the page until morning. * You should call a telemedicine doctor (or NP/PA) within the next 24 hours, if your own doctor is not available. CALL BACK IF: * Fever over 103 F (39.4 C) * Chest pain or difficulty breathing occurs * You become worse CARE ADVICE given per COVID-19 - DIAGNOSED OR SUSPECTED (Adult) guideline. Comments User: Robynn Pane, RN Date/Time [place] Ghazi Time): 02/10/2021 8:42:29 AM Caller says Oxygen level is 98% , she says she is coughing non stop even with meds. She is unable to get a Telehealth visit. Per triage outcome attempted to page On call , but per the On Call schedule "Send patients to ED or UC No On Call available until 1pm" . Patient notified and she doesn't want to go to ED , she will call back this afternoon.

## 2021-02-13 NOTE — Telephone Encounter (Signed)
Left message to return call to our office.  Wanted to check on her

## 2021-03-06 DIAGNOSIS — L700 Acne vulgaris: Secondary | ICD-10-CM | POA: Diagnosis not present

## 2021-03-06 DIAGNOSIS — L910 Hypertrophic scar: Secondary | ICD-10-CM | POA: Diagnosis not present

## 2021-03-06 DIAGNOSIS — Z79899 Other long term (current) drug therapy: Secondary | ICD-10-CM | POA: Diagnosis not present

## 2021-05-15 DIAGNOSIS — L720 Epidermal cyst: Secondary | ICD-10-CM | POA: Diagnosis not present

## 2021-05-15 DIAGNOSIS — L700 Acne vulgaris: Secondary | ICD-10-CM | POA: Diagnosis not present

## 2021-05-15 DIAGNOSIS — L910 Hypertrophic scar: Secondary | ICD-10-CM | POA: Diagnosis not present

## 2021-05-15 DIAGNOSIS — Z79899 Other long term (current) drug therapy: Secondary | ICD-10-CM | POA: Diagnosis not present

## 2021-06-20 DIAGNOSIS — Z130 Encounter for screening for diseases of the blood and blood-forming organs and certain disorders involving the immune mechanism: Secondary | ICD-10-CM | POA: Diagnosis not present

## 2021-06-20 DIAGNOSIS — Z13220 Encounter for screening for lipoid disorders: Secondary | ICD-10-CM | POA: Diagnosis not present

## 2021-06-20 DIAGNOSIS — Z131 Encounter for screening for diabetes mellitus: Secondary | ICD-10-CM | POA: Diagnosis not present

## 2021-06-20 DIAGNOSIS — Z0000 Encounter for general adult medical examination without abnormal findings: Secondary | ICD-10-CM | POA: Diagnosis not present

## 2021-08-24 ENCOUNTER — Other Ambulatory Visit: Payer: Self-pay | Admitting: Obstetrics and Gynecology

## 2021-08-24 DIAGNOSIS — Z1231 Encounter for screening mammogram for malignant neoplasm of breast: Secondary | ICD-10-CM

## 2021-09-26 ENCOUNTER — Ambulatory Visit
Admission: RE | Admit: 2021-09-26 | Discharge: 2021-09-26 | Disposition: A | Discharge status: Home / Self Care | Payer: BC Managed Care – PPO | Source: Ambulatory Visit | Attending: Obstetrics and Gynecology | Admitting: Obstetrics and Gynecology

## 2021-09-26 DIAGNOSIS — Z1231 Encounter for screening mammogram for malignant neoplasm of breast: Secondary | ICD-10-CM | POA: Diagnosis not present

## 2021-11-13 DIAGNOSIS — L700 Acne vulgaris: Secondary | ICD-10-CM | POA: Diagnosis not present

## 2021-11-13 DIAGNOSIS — L720 Epidermal cyst: Secondary | ICD-10-CM | POA: Diagnosis not present

## 2021-11-13 DIAGNOSIS — L910 Hypertrophic scar: Secondary | ICD-10-CM | POA: Diagnosis not present

## 2022-01-02 DIAGNOSIS — L910 Hypertrophic scar: Secondary | ICD-10-CM | POA: Diagnosis not present

## 2022-01-02 DIAGNOSIS — L720 Epidermal cyst: Secondary | ICD-10-CM | POA: Diagnosis not present

## 2022-05-22 DIAGNOSIS — S46912A Strain of unspecified muscle, fascia and tendon at shoulder and upper arm level, left arm, initial encounter: Secondary | ICD-10-CM | POA: Diagnosis not present

## 2022-05-22 DIAGNOSIS — M7552 Bursitis of left shoulder: Secondary | ICD-10-CM | POA: Diagnosis not present

## 2022-06-14 DIAGNOSIS — M25819 Other specified joint disorders, unspecified shoulder: Secondary | ICD-10-CM | POA: Diagnosis not present

## 2022-06-14 DIAGNOSIS — S46912D Strain of unspecified muscle, fascia and tendon at shoulder and upper arm level, left arm, subsequent encounter: Secondary | ICD-10-CM | POA: Diagnosis not present

## 2022-07-02 DIAGNOSIS — M25819 Other specified joint disorders, unspecified shoulder: Secondary | ICD-10-CM | POA: Diagnosis not present

## 2022-07-02 DIAGNOSIS — S46912D Strain of unspecified muscle, fascia and tendon at shoulder and upper arm level, left arm, subsequent encounter: Secondary | ICD-10-CM | POA: Diagnosis not present

## 2022-07-08 DIAGNOSIS — Z13220 Encounter for screening for lipoid disorders: Secondary | ICD-10-CM | POA: Diagnosis not present

## 2022-07-08 DIAGNOSIS — N952 Postmenopausal atrophic vaginitis: Secondary | ICD-10-CM | POA: Diagnosis not present

## 2022-07-08 DIAGNOSIS — Z131 Encounter for screening for diabetes mellitus: Secondary | ICD-10-CM | POA: Diagnosis not present

## 2022-07-08 DIAGNOSIS — Z90710 Acquired absence of both cervix and uterus: Secondary | ICD-10-CM | POA: Diagnosis not present

## 2022-07-08 DIAGNOSIS — Z1329 Encounter for screening for other suspected endocrine disorder: Secondary | ICD-10-CM | POA: Diagnosis not present

## 2022-07-08 DIAGNOSIS — Z01419 Encounter for gynecological examination (general) (routine) without abnormal findings: Secondary | ICD-10-CM | POA: Diagnosis not present

## 2022-07-25 DIAGNOSIS — M25819 Other specified joint disorders, unspecified shoulder: Secondary | ICD-10-CM | POA: Diagnosis not present

## 2022-07-25 DIAGNOSIS — S46912D Strain of unspecified muscle, fascia and tendon at shoulder and upper arm level, left arm, subsequent encounter: Secondary | ICD-10-CM | POA: Diagnosis not present

## 2022-08-12 DIAGNOSIS — M67814 Other specified disorders of tendon, left shoulder: Secondary | ICD-10-CM | POA: Diagnosis not present

## 2022-08-20 DIAGNOSIS — M7552 Bursitis of left shoulder: Secondary | ICD-10-CM | POA: Diagnosis not present

## 2022-09-20 DIAGNOSIS — S46912D Strain of unspecified muscle, fascia and tendon at shoulder and upper arm level, left arm, subsequent encounter: Secondary | ICD-10-CM | POA: Diagnosis not present

## 2022-09-20 DIAGNOSIS — M7552 Bursitis of left shoulder: Secondary | ICD-10-CM | POA: Diagnosis not present

## 2022-09-26 ENCOUNTER — Other Ambulatory Visit: Payer: Self-pay | Admitting: Obstetrics and Gynecology

## 2022-09-26 DIAGNOSIS — Z1231 Encounter for screening mammogram for malignant neoplasm of breast: Secondary | ICD-10-CM

## 2022-10-09 DIAGNOSIS — Z86711 Personal history of pulmonary embolism: Secondary | ICD-10-CM | POA: Diagnosis not present

## 2022-10-09 DIAGNOSIS — N941 Unspecified dyspareunia: Secondary | ICD-10-CM | POA: Diagnosis not present

## 2022-11-14 ENCOUNTER — Ambulatory Visit
Admission: RE | Admit: 2022-11-14 | Discharge: 2022-11-14 | Disposition: A | Discharge status: Home / Self Care | Payer: BC Managed Care – PPO | Source: Ambulatory Visit | Attending: Obstetrics and Gynecology | Admitting: Obstetrics and Gynecology

## 2022-11-14 DIAGNOSIS — Z1231 Encounter for screening mammogram for malignant neoplasm of breast: Secondary | ICD-10-CM | POA: Diagnosis not present

## 2023-05-14 DIAGNOSIS — M7552 Bursitis of left shoulder: Secondary | ICD-10-CM | POA: Diagnosis not present

## 2023-07-15 ENCOUNTER — Other Ambulatory Visit: Payer: Self-pay | Admitting: Obstetrics and Gynecology

## 2023-07-15 DIAGNOSIS — Z01419 Encounter for gynecological examination (general) (routine) without abnormal findings: Secondary | ICD-10-CM | POA: Diagnosis not present

## 2023-07-15 DIAGNOSIS — E2839 Other primary ovarian failure: Secondary | ICD-10-CM

## 2023-07-15 DIAGNOSIS — R6882 Decreased libido: Secondary | ICD-10-CM | POA: Diagnosis not present

## 2023-07-15 DIAGNOSIS — Z0000 Encounter for general adult medical examination without abnormal findings: Secondary | ICD-10-CM | POA: Diagnosis not present

## 2023-07-30 ENCOUNTER — Other Ambulatory Visit: Payer: Self-pay | Admitting: Obstetrics and Gynecology

## 2023-07-30 DIAGNOSIS — Z1231 Encounter for screening mammogram for malignant neoplasm of breast: Secondary | ICD-10-CM

## 2023-09-17 DIAGNOSIS — L299 Pruritus, unspecified: Secondary | ICD-10-CM | POA: Diagnosis not present

## 2023-09-17 DIAGNOSIS — R208 Other disturbances of skin sensation: Secondary | ICD-10-CM | POA: Diagnosis not present

## 2023-09-17 DIAGNOSIS — L910 Hypertrophic scar: Secondary | ICD-10-CM | POA: Diagnosis not present

## 2023-11-17 ENCOUNTER — Ambulatory Visit
Admission: RE | Admit: 2023-11-17 | Discharge: 2023-11-17 | Disposition: A | Discharge status: Home / Self Care | Payer: BC Managed Care – PPO | Source: Ambulatory Visit | Attending: Obstetrics and Gynecology | Admitting: Obstetrics and Gynecology

## 2023-11-17 DIAGNOSIS — Z1231 Encounter for screening mammogram for malignant neoplasm of breast: Secondary | ICD-10-CM

## 2023-11-20 DIAGNOSIS — L910 Hypertrophic scar: Secondary | ICD-10-CM | POA: Diagnosis not present

## 2023-11-20 DIAGNOSIS — R208 Other disturbances of skin sensation: Secondary | ICD-10-CM | POA: Diagnosis not present

## 2023-11-20 DIAGNOSIS — R0789 Other chest pain: Secondary | ICD-10-CM | POA: Diagnosis not present

## 2023-12-29 DIAGNOSIS — Z780 Asymptomatic menopausal state: Secondary | ICD-10-CM | POA: Diagnosis not present

## 2023-12-29 DIAGNOSIS — R232 Flushing: Secondary | ICD-10-CM | POA: Diagnosis not present

## 2024-01-27 DIAGNOSIS — L299 Pruritus, unspecified: Secondary | ICD-10-CM | POA: Diagnosis not present

## 2024-01-27 DIAGNOSIS — R208 Other disturbances of skin sensation: Secondary | ICD-10-CM | POA: Diagnosis not present

## 2024-01-27 DIAGNOSIS — L910 Hypertrophic scar: Secondary | ICD-10-CM | POA: Diagnosis not present

## 2024-01-29 DIAGNOSIS — M7552 Bursitis of left shoulder: Secondary | ICD-10-CM | POA: Diagnosis not present

## 2024-01-29 DIAGNOSIS — S46912A Strain of unspecified muscle, fascia and tendon at shoulder and upper arm level, left arm, initial encounter: Secondary | ICD-10-CM | POA: Diagnosis not present

## 2024-01-29 DIAGNOSIS — M25819 Other specified joint disorders, unspecified shoulder: Secondary | ICD-10-CM | POA: Diagnosis not present

## 2024-03-16 DIAGNOSIS — L299 Pruritus, unspecified: Secondary | ICD-10-CM | POA: Diagnosis not present

## 2024-03-16 DIAGNOSIS — R208 Other disturbances of skin sensation: Secondary | ICD-10-CM | POA: Diagnosis not present

## 2024-03-16 DIAGNOSIS — L910 Hypertrophic scar: Secondary | ICD-10-CM | POA: Diagnosis not present

## 2024-03-19 ENCOUNTER — Other Ambulatory Visit: Payer: BC Managed Care – PPO

## 2024-04-07 ENCOUNTER — Ambulatory Visit
Admission: RE | Admit: 2024-04-07 | Discharge: 2024-04-07 | Disposition: A | Discharge status: Home / Self Care | Source: Ambulatory Visit | Attending: Obstetrics and Gynecology | Admitting: Obstetrics and Gynecology

## 2024-04-07 DIAGNOSIS — Z780 Asymptomatic menopausal state: Secondary | ICD-10-CM | POA: Diagnosis not present

## 2024-04-07 DIAGNOSIS — M8588 Other specified disorders of bone density and structure, other site: Secondary | ICD-10-CM | POA: Diagnosis not present

## 2024-04-07 DIAGNOSIS — E2839 Other primary ovarian failure: Secondary | ICD-10-CM | POA: Insufficient documentation

## 2024-05-11 DIAGNOSIS — L299 Pruritus, unspecified: Secondary | ICD-10-CM | POA: Diagnosis not present

## 2024-05-11 DIAGNOSIS — L910 Hypertrophic scar: Secondary | ICD-10-CM | POA: Diagnosis not present

## 2024-05-11 DIAGNOSIS — R208 Other disturbances of skin sensation: Secondary | ICD-10-CM | POA: Diagnosis not present

## 2024-08-17 DIAGNOSIS — R208 Other disturbances of skin sensation: Secondary | ICD-10-CM | POA: Diagnosis not present

## 2024-08-17 DIAGNOSIS — L299 Pruritus, unspecified: Secondary | ICD-10-CM | POA: Diagnosis not present

## 2024-08-17 DIAGNOSIS — L910 Hypertrophic scar: Secondary | ICD-10-CM | POA: Diagnosis not present

## 2024-09-24 ENCOUNTER — Other Ambulatory Visit: Payer: Self-pay
